# Patient Record
Sex: Female | Born: 1970 | Hispanic: Refuse to answer | Marital: Single | Smoking: Never smoker
Health system: Southern US, Community
[De-identification: ages and names within clinical notes are randomized; demographics above are authoritative.]

## PROBLEM LIST (undated history)

## (undated) DIAGNOSIS — T7840XA Allergy, unspecified, initial encounter: Secondary | ICD-10-CM

## (undated) DIAGNOSIS — F419 Anxiety disorder, unspecified: Secondary | ICD-10-CM

## (undated) DIAGNOSIS — D219 Benign neoplasm of connective and other soft tissue, unspecified: Secondary | ICD-10-CM

## (undated) DIAGNOSIS — N912 Amenorrhea, unspecified: Secondary | ICD-10-CM

## (undated) HISTORY — PX: WISDOM TOOTH EXTRACTION: SHX21

## (undated) HISTORY — DX: Anxiety disorder, unspecified: F41.9

## (undated) HISTORY — DX: Benign neoplasm of connective and other soft tissue, unspecified: D21.9

## (undated) HISTORY — DX: Allergy, unspecified, initial encounter: T78.40XA

## (undated) HISTORY — DX: Amenorrhea, unspecified: N91.2

## (undated) HISTORY — PX: ROOT CANAL: SHX2363

---

## 2016-07-07 ENCOUNTER — Telehealth: Payer: Self-pay | Admitting: Obstetrics and Gynecology

## 2016-07-07 NOTE — Telephone Encounter (Signed)
Called and left a message for patient to call back to schedule a new patient doctor referral. °

## 2016-07-10 NOTE — Telephone Encounter (Signed)
Called and left a message for patient to call back to schedule a new patient doctor referral. °

## 2016-07-14 ENCOUNTER — Encounter: Payer: Self-pay | Admitting: Obstetrics and Gynecology

## 2016-07-14 ENCOUNTER — Ambulatory Visit (INDEPENDENT_AMBULATORY_CARE_PROVIDER_SITE_OTHER): Payer: BLUE CROSS/BLUE SHIELD | Admitting: Obstetrics and Gynecology

## 2016-07-14 VITALS — BP 104/60 | HR 82 | Resp 16 | Ht 67.5 in | Wt 146.4 lb

## 2016-07-14 DIAGNOSIS — Z01419 Encounter for gynecological examination (general) (routine) without abnormal findings: Secondary | ICD-10-CM

## 2016-07-14 DIAGNOSIS — H9319 Tinnitus, unspecified ear: Secondary | ICD-10-CM

## 2016-07-14 DIAGNOSIS — R42 Dizziness and giddiness: Secondary | ICD-10-CM | POA: Diagnosis not present

## 2016-07-14 DIAGNOSIS — N951 Menopausal and female climacteric states: Secondary | ICD-10-CM | POA: Diagnosis not present

## 2016-07-14 DIAGNOSIS — Z124 Encounter for screening for malignant neoplasm of cervix: Secondary | ICD-10-CM

## 2016-07-14 DIAGNOSIS — N911 Secondary amenorrhea: Secondary | ICD-10-CM

## 2016-07-14 DIAGNOSIS — L293 Anogenital pruritus, unspecified: Secondary | ICD-10-CM | POA: Diagnosis not present

## 2016-07-14 LAB — POCT URINE PREGNANCY: PREG TEST UR: NEGATIVE

## 2016-07-14 MED ORDER — BETAMETHASONE VALERATE 0.1 % EX OINT: TOPICAL_OINTMENT | CUTANEOUS | 0 refills | Status: DC

## 2016-07-14 MED ORDER — MEDROXYPROGESTERONE ACETATE 5 MG PO TABS: ORAL_TABLET | ORAL | 0 refills | Status: DC

## 2016-07-14 NOTE — Progress Notes (Signed)
45 y.o. G0P0000 MarriedCaucasianF here for evaluation from Ronny Bacon, ANP at [facility] for amenorrhea and irregular cycles. She is also due for an annual exam. FSH was 90.3 and her prolactin was "normal" the patient was told her TSH was normal as well. LMP in 4/17. Prior to April her cycles were every 1-2 months for the last year (previously monthly).  Period Duration (Days): 4 days Period Pattern: (!) Irregular Menstrual Flow: Moderate Menstrual Control: Maxi pad, Tampon Menstrual Control Change Freq (Hours): 2-3 hours on heavy days Dysmenorrhea: (!) Mild Dysmenorrhea Symptoms: Cramping  She was under a lot of stress in 4/17, getting better.  For a period of some months last year she was not in a good living environment. She felt a racing heart, buzzy in her ears. Feeling of vibration over her body. Those symptoms have improved.  Some night sweats, tolerable. No hot flashes. No vaginal dryness. Occasionally sexually active, no pain. Some episodes of feeling dizzy or off, not unstable. Some issues with focus and low energy. Low level depression. She has had issue with constipation, resolved with dietary changes.  She does have dry skin, some hair thinning.  Not sleeping great.  She would like to have a baby, wondering if her cycles may come back to help with conception  Patient's last menstrual period was 02/02/2016.          Sexually active: Yes.    The current method of family planning is none.    Exercising: Yes.    Various Smoker:  no  Health Maintenance: Pap:  2016 WNL per patient History of abnormal Pap:  no MMG: 2016 per patient  Colonoscopy:  no BMD:   no TDaP:  10/27/2015    reports that she has never smoked. She has never used smokeless tobacco. She reports that she drinks about 0.6 oz of alcohol per week . She reports that she does not use drugs. She is Systems analyst at [employer].   Past Medical History:  Diagnosis Date  . Amenorrhea     Past Surgical  History:  Procedure Laterality Date  . ROOT CANAL    . WISDOM TOOTH EXTRACTION      Current Outpatient Prescriptions  Medication Sig Dispense Refill  . b complex vitamins capsule Take 1 capsule by mouth daily.    . Iodine, Kelp, (KELP PO) Take by mouth.    . Multiple Vitamin (MULTIVITAMIN) capsule Take 1 capsule by mouth daily.    . Omega-3 Fatty Acids (FISH OIL) 1000 MG CAPS Take by mouth.    Marland Kitchen UNABLE TO FIND Med Name: Tumeric     No current facility-administered medications for this visit.     Family History  Problem Relation Age of Onset  . Thyroid disease Father   . Hyperthyroidism Father   . Hypertension Maternal Aunt   . Heart attack Maternal Aunt 80  . Diabetes Paternal Aunt   . Heart attack Paternal Aunt 99    Review of Systems  Constitutional: Negative.   HENT: Negative.   Eyes: Negative.   Respiratory: Negative.   Cardiovascular: Negative.   Gastrointestinal: Negative.   Endocrine: Negative.   Genitourinary:       Amenorrhea Irregular Menses   Musculoskeletal: Negative.   Skin: Negative.   Allergic/Immunologic: Negative.   Neurological: Negative.   Hematological: Negative.   Psychiatric/Behavioral: Negative.     Exam:   BP 104/60 (BP Location: Right Arm, Patient Position: Sitting, Cuff Size: Normal)   Pulse 82  Resp 16   Ht 5' 7.5" (1.715 m)   Wt 146 lb 6.4 oz (66.4 kg)   LMP 02/02/2016   BMI 22.59 kg/m   Weight change: @WEIGHTCHANGE @ Height:   Height: 5' 7.5" (171.5 cm)  Ht Readings from Last 3 Encounters:  07/14/16 5' 7.5" (1.715 m)    General appearance: alert, cooperative and appears stated age Head: Normocephalic, without obvious abnormality, atraumatic Neck: no adenopathy, supple, symmetrical, trachea midline and thyroid normal to inspection and palpation Lungs: clear to auscultation bilaterally Breasts: normal appearance, no masses or tenderness Heart: regular rate and rhythm Abdomen: soft, non-tender; bowel sounds normal; no masses,   no organomegaly Extremities: extremities normal, atraumatic, no cyanosis or edema Skin: Skin color, texture, turgor normal. No rashes or lesions Lymph nodes: Cervical, supraclavicular, and axillary nodes normal. No abnormal inguinal nodes palpated Neurologic: Grossly normal   Pelvic: External genitalia:  On her upper labia majora (L>R) are patches of erythema/irritation              Urethra:  normal appearing urethra with no masses, tenderness or lesions              Bartholins and Skenes: normal                 Vagina: normal appearing vagina with normal color and discharge, no lesions              Cervix: no lesions               Bimanual Exam:  Uterus:  normal size, contour, position, consistency, mobility, non-tender              Adnexa: no mass, fullness, tenderness               Rectovaginal: Confirms               Anus:  normal sphincter tone, no lesions  Chaperone was present for exam.  A:  Well Woman with normal exam  Genital pruritus, seems focal, but will r/o vaginitis  Desires pregnancy (no current partners)  Perimenopausal  Amenorrhea, PMP FSH level  Buzzing in her ears  Sensation of vibrations, slightly dizzy  Low focus and low energy, she reports normal TSH  P:   Pap with hpv  Steroids for vulvar dermatitis  Provera withdrawal now and every other month if no spontaneous cycles  Refer to ENT  She needs an internist given all of her systemic symptoms, #'s given  Given # for E&I  Mammogram q year    CC: Ronny Bacon, ANP at [facility]

## 2016-07-15 LAB — WET PREP BY MOLECULAR PROBE
Candida species: NEGATIVE
GARDNERELLA VAGINALIS: POSITIVE — AB
TRICHOMONAS VAG: NEGATIVE

## 2016-07-16 LAB — IPS PAP TEST WITH HPV

## 2016-07-20 ENCOUNTER — Telehealth: Payer: Self-pay | Admitting: *Deleted

## 2016-07-20 MED ORDER — METRONIDAZOLE 0.75 % VA GEL: 1.0000 | Freq: Every day | VAGINAL | 0 refills | Status: DC

## 2016-07-20 NOTE — Telephone Encounter (Signed)
-----   Message from Salvadore Dom, MD sent at 07/17/2016 12:21 PM EDT ----- Please inform the patient that her wet prep returned with BV. She was c/o patchy itching externally. She doesn't have to treat the BV if not symptomatic (typically increased d/c and odor, can get vulvar irritation) If she wants treatment, no ETOH while on Flagyl.  Oral: Flagyl 500 mg BID x 7 days, or Vaginal: Metrogel, 1 applicator per vagina q day x 5 days. 02

## 2016-07-20 NOTE — Telephone Encounter (Signed)
Left message to call Sharee Pimple back at [phone].

## 2016-07-20 NOTE — Telephone Encounter (Signed)
Spoke with patient. Patient calling to get name of medication that was sent to pharmacy. Advised metronidazole (Metrogel) 0.75%. Patient states she may need RX sent to [facility] instead; patient to return call if she needs pharmacy changed.

## 2016-07-21 ENCOUNTER — Telehealth: Payer: Self-pay | Admitting: Obstetrics and Gynecology

## 2016-07-21 MED ORDER — METRONIDAZOLE 0.75 % VA GEL
1.0000 | Freq: Every day | VAGINAL | 0 refills | Status: AC
Start: 2016-07-21 — End: 2016-07-26

## 2016-07-21 NOTE — Telephone Encounter (Signed)
Patient would like to speak with nurse about getting a prescription for her medication sent to a different pharmacy. [facility] at [phone].

## 2016-07-21 NOTE — Telephone Encounter (Signed)
Spoke with patient. Patient request Metrogel be sent to [facility] at [phone]. Called [facility] on [facility], verified with Jenny Reichmann, medication had not been picked up and discontinued med at that time. New order sent to [facility] as requested.   Routing to provider for final review. Patient is agreeable to disposition. Will close encounter.

## 2016-07-27 ENCOUNTER — Encounter: Payer: Self-pay | Admitting: Obstetrics and Gynecology

## 2016-07-30 ENCOUNTER — Telehealth: Payer: Self-pay | Admitting: *Deleted

## 2016-07-30 NOTE — Telephone Encounter (Signed)
Spoke with patient. Patient calling to review lab results again as seen below. Patient states she just received message that pharmacy has medication in stock. Patient verbalizes understanding.   Routing to provider for final review. Patient is agreeable to disposition. Will close encounter.      Notes Recorded by Burnice Logan, RN on 07/20/2016 at 9:29 AM EDT Advised of results as seen below per Dr. Talbert Nan. Metrogel sent to pharmacy on file, verified by patient.-jh 02 recall placed. ------  Notes Recorded by Salvadore Dom, MD on 07/17/2016 at 12:21 PM EDT Please inform the patient that her wet prep returned with BV. She was c/o patchy itching externally. She doesn't have to treat the BV if not symptomatic (typically increased d/c and odor, can get vulvar irritation) If she wants treatment, no ETOH while on Flagyl.  Oral: Flagyl 500 mg BID x 7 days, or Vaginal: Metrogel, 1 applicator per vagina q day x 5 days. 02

## 2016-09-23 ENCOUNTER — Encounter: Payer: Self-pay | Admitting: Student

## 2016-09-23 ENCOUNTER — Ambulatory Visit (INDEPENDENT_AMBULATORY_CARE_PROVIDER_SITE_OTHER): Payer: BLUE CROSS/BLUE SHIELD | Admitting: Student

## 2016-09-23 VITALS — BP 115/76 | Ht 67.5 in | Wt 148.0 lb

## 2016-09-23 DIAGNOSIS — G8929 Other chronic pain: Secondary | ICD-10-CM | POA: Insufficient documentation

## 2016-09-23 DIAGNOSIS — M545 Low back pain: Secondary | ICD-10-CM | POA: Diagnosis not present

## 2016-09-23 DIAGNOSIS — M9905 Segmental and somatic dysfunction of pelvic region: Secondary | ICD-10-CM | POA: Diagnosis not present

## 2016-09-23 DIAGNOSIS — M9903 Segmental and somatic dysfunction of lumbar region: Secondary | ICD-10-CM

## 2016-09-23 DIAGNOSIS — M9904 Segmental and somatic dysfunction of sacral region: Secondary | ICD-10-CM | POA: Diagnosis not present

## 2016-09-23 DIAGNOSIS — I5032 Chronic diastolic (congestive) heart failure: Secondary | ICD-10-CM | POA: Insufficient documentation

## 2016-09-23 NOTE — Assessment & Plan Note (Signed)
Discussed benefits/risk of osteopathic manipulative therapy discussed with patient.  Manipulation performed in form of HVLA, muscle energy, myofascial release performed to lumbar spine, pelvis, sacrum.  Patient tolerated therapy well.  Subjective and objective improvement noted. Instructed that may be sore overnight and can take anti-inflammatories for pain.  Advised to stay hydrated today.  Exercises given to patient and demonstrated in office.

## 2016-09-23 NOTE — Progress Notes (Signed)
  Valerie Gregory - 45 y.o. female MRN [id number]  Date of birth: Oct 18, 1971  SUBJECTIVE:  Including CC & ROS.  CC: Low back pain  Complains of low back pain that has been ongoing for unspecified amount time. She denies any radicular symptoms or any numbness or tingling. She reports that it sometimes keeps her up at night. She has tried anti-inflammatories without much relief. She has been doing exercises for the back without much relief. 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 She was seen at [facility] for this and given exercises to do. She has been doing them intermittently.     ROS: No unexpected weight loss, fever, chills, swelling, instability, muscle pain, numbness/tingling, redness, otherwise see HPI   PMHx - Updated and reviewed.  Contributory factors include: Negative PSHx - Updated and reviewed.  Contributory factors include:  Negative FHx - Updated and reviewed.  Contributory factors include:  Negative Social Hx - Updated and reviewed. Contributory factors include: Negative Medications - reviewed   DATA REVIEWED: none  PHYSICAL EXAM:  VS: BP:115/76  HR: bpm  TEMP: ( )  RESP:   HT:5' 7.5" (171.5 cm)   WT:148 lb (67.1 kg)  BMI:22.9 PHYSICAL EXAM: Gen: NAD, alert, cooperative with exam, well-appearing HEENT: clear conjunctiva,  CV:  no edema, capillary refill brisk, normal rate Resp: non-labored Skin: no rashes, normal turgor  Neuro: no gross deficits.  Psych:  alert and oriented  Back Exam:  Inspection: Unremarkable  Palpable tenderness: Right greater than left paraspinal hypertonicity. Range of Motion:  Flexion 45 deg; Extension 45 deg; Side Bending to 45 deg bilaterally; Rotation to 45 deg bilaterally  Leg strength: Quad: 5/5 Hamstring: 5/5 Hip flexor: 5/5 Hip abductors: 5/5  Strength at foot: Plantar-flexion: 5/5 Dorsi-flexion: 5/5 Eversion: 5/5 Inversion: 5/5  Sensory change: Gross sensation intact to all lumbar and sacral dermatomes.  Reflexes: 2+ at both patellar  tendons, 2+ at achilles tendons, Babinski's downgoing.  Gait unremarkable. SLR laying: Negative  XSLR laying: Negative  FABER: negative.  OSE: B/l paraspinal hypertonicity in lumbar spine R>L RlSr L2-5  Right anterior innominate   ASSESSMENT & PLAN:   Chronic bilateral low back pain without sciatica Discussed benefits/risk of osteopathic manipulative therapy discussed with patient.  Manipulation performed in form of HVLA, muscle energy, myofascial release performed to lumbar spine, pelvis, sacrum.  Patient tolerated therapy well.  Subjective and objective improvement noted. Instructed that may be sore overnight and can take anti-inflammatories for pain.  Advised to stay hydrated today.  Exercises given to patient and demonstrated in office.

## 2017-01-15 ENCOUNTER — Telehealth: Payer: Self-pay | Admitting: Obstetrics and Gynecology

## 2017-01-15 NOTE — Telephone Encounter (Signed)
Left message to call Knolan Simien at [phone]. 

## 2017-01-15 NOTE — Telephone Encounter (Signed)
Patient would like her  Lab results

## 2017-01-18 NOTE — Telephone Encounter (Signed)
Left message to call Valerie Gregory at [phone]. 

## 2017-01-18 NOTE — Telephone Encounter (Signed)
Spoke with patient. Patient states she is calling about her [facility] lab results from 06/2016. Advised per review of chart no [place] level was drawn. "I just can't remember the results." Patient had a pap and affirm testing on 07/14/2016. Patient states she had hormone levels checked and was contacted by our office to review these results. Reviewed Care Everywhere and I do not see any hormone level testing. Patient states this was done with our office. Advised will review with Dr.Jertson and return call. Patient is agreeable.  Per further review of chart I see that hormone testing was done at [place] center on 07/02/2016 and patient was referred to our office based on this visit. Routing to [place] for review of [place] level which was 90.3 normal 1.6-8.0 and Prolactin was normal.

## 2017-01-18 NOTE — Telephone Encounter (Signed)
Her FSH was in the postmenopausal range at 90. She was still considered peri-menopausal because she hadn't gone a year without menses. She was given cyclic provera to take every other month, check if she ever took this. Also see if she has had any cycles or bleeding since her last visit. Did she have a particular question about her hormones?

## 2017-01-18 NOTE — Telephone Encounter (Signed)
Patient returning your call.

## 2017-02-04 NOTE — Telephone Encounter (Signed)
A year without menses with her prior elevated FSH level is consistent with menopause. She could see the infertility specialist and discuss donor egg.  If she is interested, we can refer her.

## 2017-02-04 NOTE — Telephone Encounter (Signed)
Spoke with patient. Advised of message as seen below from [place]. Patient verbalizes understanding. Patient states that she has not had any bleeding since 02/10/2016. States she did not take Provera as prescribed. Patient reports she was calling as she is interested in possible fertility. Has questions about next steps in possibly getting pregnant with her hormone levels. Advised will review with Dr.Jertson and return call with recommendations for next steps. Advised additional lab work and referral to fertility specialist will likely be next steps.

## 2017-02-05 NOTE — Telephone Encounter (Signed)
Left message to call Koralyn Prestage at [phone]. 

## 2017-02-15 NOTE — Telephone Encounter (Signed)
Left message to call Tiarna Koppen at [phone]. 

## 2017-02-18 NOTE — Telephone Encounter (Signed)
Dr.Jertson, I have attempted to reach this patient x 2 without return call. Okay to close encounter?

## 2017-02-18 NOTE — Telephone Encounter (Signed)
Will close encounter

## 2017-12-16 ENCOUNTER — Other Ambulatory Visit: Payer: Self-pay | Admitting: Nurse Practitioner

## 2017-12-16 DIAGNOSIS — Z1231 Encounter for screening mammogram for malignant neoplasm of breast: Secondary | ICD-10-CM

## 2019-12-31 ENCOUNTER — Ambulatory Visit: Payer: Self-pay | Attending: Internal Medicine

## 2019-12-31 DIAGNOSIS — Z23 Encounter for immunization: Secondary | ICD-10-CM | POA: Insufficient documentation

## 2019-12-31 NOTE — Progress Notes (Signed)
   Covid-19 Vaccination Clinic  Name:  Valerie Gregory    MRN: [id number] DOB: [birth year]  12/31/2019  Ms. He was observed post Covid-19 immunization for 30 minutes based on pre-vaccination screening without incident. She was provided with Vaccine Information Sheet and instruction to access the V-Safe system.   [name] was instructed to call 911 with any severe reactions post vaccine: Marland Kitchen Difficulty breathing  . Swelling of face and throat  . A fast heartbeat  . A bad rash all over body  . Dizziness and weakness   Immunizations Administered    Name Date Dose VIS Date Route   Pfizer COVID-19 Vaccine 12/31/2019  1:03 PM 0.3 mL 10/06/2019 Intramuscular   Manufacturer: [place]   Lot: [id number]   [place]: [id number]

## 2020-01-31 ENCOUNTER — Ambulatory Visit: Payer: Self-pay

## 2020-02-14 ENCOUNTER — Ambulatory Visit: Payer: Self-pay | Attending: Internal Medicine

## 2020-02-14 DIAGNOSIS — Z23 Encounter for immunization: Secondary | ICD-10-CM

## 2020-02-14 NOTE — Progress Notes (Signed)
   Covid-19 Vaccination Clinic  Name:  Valerie Gregory    MRN: [id number] DOB: [birth year]  02/14/2020  [name] was observed post Covid-19 immunization for 15 minutes without incident. She was provided with Vaccine Information Sheet and instruction to access the V-Safe system.   [name] was instructed to call 911 with any severe reactions post vaccine: Marland Kitchen Difficulty breathing  . Swelling of face and throat  . A fast heartbeat  . A bad rash all over body  . Dizziness and weakness   Immunizations Administered    Name Date Dose VIS Date Route   Pfizer COVID-19 Vaccine 02/14/2020  9:44 AM 0.3 mL 12/20/2018 Intramuscular   Manufacturer: [place]   Lot: [id number]   [place]: [id number]

## 2020-03-27 ENCOUNTER — Ambulatory Visit: Payer: 59 | Admitting: Psychology

## 2020-04-09 ENCOUNTER — Ambulatory Visit (INDEPENDENT_AMBULATORY_CARE_PROVIDER_SITE_OTHER): Payer: 59 | Admitting: Psychology

## 2020-04-09 DIAGNOSIS — F411 Generalized anxiety disorder: Secondary | ICD-10-CM

## 2020-04-24 ENCOUNTER — Ambulatory Visit (INDEPENDENT_AMBULATORY_CARE_PROVIDER_SITE_OTHER): Payer: 59 | Admitting: Psychology

## 2020-04-24 DIAGNOSIS — F411 Generalized anxiety disorder: Secondary | ICD-10-CM | POA: Diagnosis not present

## 2020-05-01 ENCOUNTER — Ambulatory Visit (INDEPENDENT_AMBULATORY_CARE_PROVIDER_SITE_OTHER): Payer: 59 | Admitting: Psychology

## 2020-05-01 DIAGNOSIS — F411 Generalized anxiety disorder: Secondary | ICD-10-CM

## 2020-05-07 ENCOUNTER — Ambulatory Visit (INDEPENDENT_AMBULATORY_CARE_PROVIDER_SITE_OTHER): Payer: 59 | Admitting: Psychology

## 2020-05-07 DIAGNOSIS — F411 Generalized anxiety disorder: Secondary | ICD-10-CM | POA: Diagnosis not present

## 2020-08-08 ENCOUNTER — Other Ambulatory Visit: Payer: Self-pay

## 2020-08-08 ENCOUNTER — Encounter: Payer: Self-pay | Admitting: Family Medicine

## 2020-08-08 ENCOUNTER — Ambulatory Visit (INDEPENDENT_AMBULATORY_CARE_PROVIDER_SITE_OTHER): Payer: 59 | Admitting: Family Medicine

## 2020-08-08 VITALS — BP 105/74 | HR 86 | Temp 98.2°F | Ht 67.5 in | Wt 142.0 lb

## 2020-08-08 DIAGNOSIS — R5383 Other fatigue: Secondary | ICD-10-CM | POA: Diagnosis not present

## 2020-08-08 DIAGNOSIS — T781XXA Other adverse food reactions, not elsewhere classified, initial encounter: Secondary | ICD-10-CM

## 2020-08-08 DIAGNOSIS — H6123 Impacted cerumen, bilateral: Secondary | ICD-10-CM

## 2020-08-08 DIAGNOSIS — H538 Other visual disturbances: Secondary | ICD-10-CM

## 2020-08-08 DIAGNOSIS — R079 Chest pain, unspecified: Secondary | ICD-10-CM | POA: Diagnosis not present

## 2020-08-08 DIAGNOSIS — Z13220 Encounter for screening for lipoid disorders: Secondary | ICD-10-CM

## 2020-08-08 DIAGNOSIS — R221 Localized swelling, mass and lump, neck: Secondary | ICD-10-CM

## 2020-08-08 MED ORDER — EPINEPHRINE 0.3 MG/0.3ML IJ SOAJ: 0.3000 mg | Freq: Once | INTRAMUSCULAR | 0 refills | Status: DC | PRN

## 2020-08-08 NOTE — Progress Notes (Signed)
Valerie Gregory is a 49 y.o. female who presents today for an office visit.  She is a new patient.   Assessment/Plan:  New/Acute Problems: Chest Pain No red flags.  No current symptoms. Reassuring EKG.  Will place referral to cardiology per patient request.  Neck Mass Likely connective tissue though given length of symptoms will check ultrasound to rule out other possible causes  Blurred Vision Reassuring exam today.  Patient has family history of multiple sclerosis.  Given this in addition to her intermittent episodes of numbness will place referral to neurology for further evaluation.  May need MRI.  Cerumenosis Recommended over-the-counter Debrox or hydrogen peroxide as needed.   Chronic Problems Addressed Today: No problem-specific Assessment & Plan notes found for this encounter.     Subjective:  HPI:  She has several issues she would like to discuss today.  She has had longstanding issue with fatigue.  Does not remember when symptoms started.  She has been under quite a bit of stress recently due to declining health of her mother.  She would like to have lab work done today.  She is worried about her thyroid level and concern also about potentially being anemic.  Sleeping has been okay.  She has also had intermittent issues with chest pain for the last 5 months. Symptom started about 4 weeks after getting her second dose of Covid vaccine.  She is not sure if the 2 things are related.  Symptoms are random and come and go.  Occasionally associated with shortness of breath in the middle of the night with bradycardia into the 50s.  She would like to see a cardiologist.  She has also had intermittent facial numbness twice.  Initially happened after her first  covid vaccine.  Lasted for about a week and then subsided.  She had similar reaction after her second dose which was more intense but shorter duration.  She has also had some intermittent difficulty seeing out of her right  eye.  She associates these with pain and the right side of her neck.  She has not had any other associated weakness or numbness.  She also has a longstanding history of food intolerance.  Her source of her intolerance seems to vary.  She will sometimes have hives.  Sometimes have facial swelling and itchy sensation in her throat.  PMH:  The following were reviewed and entered/updated in epic: Past Medical History:  Diagnosis Date  . Allergy   . Amenorrhea   . Anxiety    Patient Active Problem List   Diagnosis Date Noted  . Other fatigue 08/08/2020  . Gastrointestinal food sensitivity 08/08/2020   Past Surgical History:  Procedure Laterality Date  . ROOT CANAL    . WISDOM TOOTH EXTRACTION      Family History  Problem Relation Age of Onset  . Thyroid disease Father   . Hyperthyroidism Father   . Hypertension Maternal Aunt   . Heart attack Maternal Aunt 80  . Diabetes Paternal Aunt   . Heart attack Paternal Aunt 53  . Multiple sclerosis Mother     Medications- reviewed and updated Current Outpatient Medications  Medication Sig Dispense Refill  . b complex vitamins capsule Take 1 capsule by mouth daily.    . cholecalciferol (VITAMIN D3) 25 MCG (1000 UNIT) tablet Take 1,000 Units by mouth daily.    . medroxyPROGESTERone (PROVERA) 5 MG tablet 1 tab po qd x 5 days now and every other month if no spontaneous menses. 15  tablet 0  . Multiple Vitamin (MULTIVITAMIN) capsule Take 1 capsule by mouth daily.    . Omega-3 Fatty Acids (FISH OIL) 1000 MG CAPS Take by mouth.    Marland Kitchen UNABLE TO FIND Med Name: Tumeric    . EPINEPHrine 0.3 mg/0.3 mL IJ SOAJ injection Inject 0.3 mg into the muscle once as needed (anaphylaxis/allergic reaction). 2 each 0   No current facility-administered medications for this visit.    Allergies-reviewed and updated Allergies  Allergen Reactions  . Shellfish-Derived Products Anaphylaxis, Hives and Swelling  . Flublok [Influenza Vaccine Recombinant]   . Sulfa  Antibiotics Hives    Social History   Socioeconomic History  . Marital status: Married    Spouse name: Not on file  . Number of children: Not on file  . Years of education: Not on file  . Highest education level: Not on file  Occupational History  . Not on file  Tobacco Use  . Smoking status: Never Smoker  . Smokeless tobacco: Never Used  Substance and Sexual Activity  . Alcohol use: Yes    Alcohol/week: 1.0 standard drink    Types: 1 Standard drinks or equivalent per week  . Drug use: No  . Sexual activity: Yes    Partners: Male    Birth control/protection: None  Other Topics Concern  . Not on file  Social History Narrative  . Not on file   Social Determinants of Health   Financial Resource Strain:   . Difficulty of Paying Living Expenses: Not on file  Food Insecurity:   . Worried About Charity fundraiser in the Last Year: Not on file  . Ran Out of Food in the Last Year: Not on file  Transportation Needs:   . Lack of Transportation (Medical): Not on file  . Lack of Transportation (Non-Medical): Not on file  Physical Activity:   . Days of Exercise per Week: Not on file  . Minutes of Exercise per Session: Not on file  Stress:   . Feeling of Stress : Not on file  Social Connections:   . Frequency of Communication with Friends and Family: Not on file  . Frequency of Social Gatherings with Friends and Family: Not on file  . Attends Religious Services: Not on file  . Active Member of Clubs or Organizations: Not on file  . Attends Archivist Meetings: Not on file  . Marital Status: Not on file          Objective:  Physical Exam: BP 105/74   Pulse 86   Temp 98.2 F (36.8 C) (Temporal)   Ht 5' 7.5" (1.715 m)   Wt 142 lb (64.4 kg)   SpO2 99%   BMI 21.91 kg/m   Gen: No acute distress, resting comfortably CV: Regular rate and rhythm with no murmurs appreciated HEENT: Approximately 1 cm mass on right posterior paraspinal cervical muscle group.   Nonmobile.  Narrow EAC bilaterally. Pulm: Normal work of breathing, clear to auscultation bilaterally with no crackles, wheezes, or rhonchi Neuro: Grossly normal, moves all extremities.  Reflexes 2+ and symmetric bilaterally.  Finger-nose-finger testing intact.  Cranial nerves III through XII intact Psych: Normal affect and thought content  EKG: Normal sinus rhythm.  No ischemic changes.  Time Spent: 65 minutes of total time was spent on the date of the encounter performing the following actions: chart review prior to seeing the patient, obtaining history, performing a medically necessary exam, counseling on the treatment plan including labs today and referrals,  placing orders, and documenting in our EHR.        Algis Greenhouse. Jerline Pain, MD 08/08/2020 9:32 AM

## 2020-08-08 NOTE — Assessment & Plan Note (Signed)
Discussed referral to allergist however she declined.  Will give prescription for EpiPen.  Also recommend she have Benadryl and Pepcid on hand.

## 2020-08-08 NOTE — Assessment & Plan Note (Signed)
Likely multifactorial.  Will check labs today including CBC, CMET, TSH, B12, and vitamin D.

## 2020-08-08 NOTE — Patient Instructions (Addendum)
It was very nice to see you today!  We will check blood work today.  I will place a referral for you to see the neurologist and cardiologist.  Please try using Debrox optional peroxide for your ears.  We will get an ultrasound of your neck.  I will send in a refill for your EpiPen.  Take care, Dr Jerline Pain  Please try these tips to maintain a healthy lifestyle:   Eat at least 3 REAL meals and 1-2 snacks per day.  Aim for no more than 5 hours between eating.  If you eat breakfast, please do so within one hour of getting up.    Each meal should contain half fruits/vegetables, one quarter protein, and one quarter carbs (no bigger than a computer mouse)   Cut down on sweet beverages. This includes juice, soda, and sweet tea.     Drink at least 1 glass of water with each meal and aim for at least 8 glasses per day   Exercise at least 150 minutes every week.

## 2020-08-09 LAB — COMPREHENSIVE METABOLIC PANEL
AG Ratio: 2.2 (calc) (ref 1.0–2.5)
ALT: 15 U/L (ref 6–29)
AST: 21 U/L (ref 10–35)
Albumin: 4.6 g/dL (ref 3.6–5.1)
Alkaline phosphatase (APISO): 59 U/L (ref 37–153)
BUN: 14 mg/dL (ref 7–25)
CO2: 28 mmol/L (ref 20–32)
Calcium: 9.6 mg/dL (ref 8.6–10.4)
Chloride: 107 mmol/L (ref 98–110)
Creat: 0.7 mg/dL (ref 0.50–1.05)
Globulin: 2.1 g/dL (calc) (ref 1.9–3.7)
Glucose, Bld: 95 mg/dL (ref 65–99)
Potassium: 4.2 mmol/L (ref 3.5–5.3)
Sodium: 143 mmol/L (ref 135–146)
Total Bilirubin: 1.6 mg/dL — ABNORMAL HIGH (ref 0.2–1.2)
Total Protein: 6.7 g/dL (ref 6.1–8.1)

## 2020-08-09 LAB — LIPID PANEL
Cholesterol: 191 mg/dL (ref <200)
HDL: 75 mg/dL (ref >=50)
LDL Cholesterol (Calc): 103 mg/dL (calc) — ABNORMAL HIGH
Non-HDL Cholesterol (Calc): 116 mg/dL (calc) (ref <130)
Total CHOL/HDL Ratio: 2.5 (calc) (ref <5.0)
Triglycerides: 52 mg/dL (ref <150)

## 2020-08-09 LAB — CBC
HCT: 41.3 % (ref 35.0–45.0)
Hemoglobin: 14 g/dL (ref 11.7–15.5)
MCH: 29.3 pg (ref 27.0–33.0)
MCHC: 33.9 g/dL (ref 32.0–36.0)
MCV: 86.4 fL (ref 80.0–100.0)
MPV: 10.3 fL (ref 7.5–12.5)
Platelets: 180 10*3/uL (ref 140–400)
RBC: 4.78 10*6/uL (ref 3.80–5.10)
RDW: 12.4 % (ref 11.0–15.0)
WBC: 3.5 10*3/uL — ABNORMAL LOW (ref 3.8–10.8)

## 2020-08-09 LAB — VITAMIN B12: Vitamin B-12: 507 pg/mL (ref 200–1100)

## 2020-08-09 LAB — VITAMIN D 25 HYDROXY (VIT D DEFICIENCY, FRACTURES): Vit D, 25-Hydroxy: 34 ng/mL (ref 30–100)

## 2020-08-09 LAB — TSH: TSH: 0.53 mIU/L

## 2020-08-09 NOTE — Progress Notes (Signed)
Please inform patient of the following:  Cholesterol level is borderline. Do not need to start meds but she should continue working on diet and exercise and we can recheck in a year.  Her bilirubin was elevated - this is probably benign but would like to recheck. Please place future order for fractionated bilirubin  Everything else is NORMAL.  Algis Greenhouse. Jerline Pain, MD 08/09/2020 2:04 PM

## 2020-08-13 ENCOUNTER — Telehealth: Payer: Self-pay

## 2020-08-13 ENCOUNTER — Other Ambulatory Visit: Payer: Self-pay

## 2020-08-13 DIAGNOSIS — M542 Cervicalgia: Secondary | ICD-10-CM

## 2020-08-13 NOTE — Telephone Encounter (Signed)
Pt called in and is requesting a referral to Dr. Yvetta Coder.

## 2020-08-13 NOTE — Addendum Note (Signed)
Addended by: Clyde Lundborg A on: 08/13/2020 03:07 PM   Modules accepted: Orders

## 2020-08-13 NOTE — Telephone Encounter (Signed)
Ok to place order for dg cervical spin complete.  Algis Greenhouse. Jerline Pain, MD 08/13/2020 3:01 PM

## 2020-08-13 NOTE — Telephone Encounter (Signed)
Pt called stating she would also like an x-ray of her neck.

## 2020-08-13 NOTE — Telephone Encounter (Signed)
Ok to order or OV needed?

## 2020-08-13 NOTE — Addendum Note (Signed)
Addended by: Clyde Lundborg A on: 08/13/2020 03:37 PM   Modules accepted: Orders

## 2020-08-13 NOTE — Telephone Encounter (Signed)
Xray ordered, called to make pt aware however pt vm has not been set up.

## 2020-08-13 NOTE — Telephone Encounter (Signed)
Called and spoke with pt, pt needs this referral for her neck pain. Referral has been placed.

## 2020-08-14 ENCOUNTER — Ambulatory Visit
Admission: RE | Admit: 2020-08-14 | Discharge: 2020-08-14 | Disposition: A | Discharge status: Home / Self Care | Payer: 59 | Source: Ambulatory Visit | Attending: Family Medicine | Admitting: Family Medicine

## 2020-08-14 DIAGNOSIS — M542 Cervicalgia: Secondary | ICD-10-CM

## 2020-08-14 DIAGNOSIS — R221 Localized swelling, mass and lump, neck: Secondary | ICD-10-CM

## 2020-08-15 ENCOUNTER — Other Ambulatory Visit: Payer: Self-pay

## 2020-08-15 ENCOUNTER — Ambulatory Visit (INDEPENDENT_AMBULATORY_CARE_PROVIDER_SITE_OTHER): Payer: 59 | Admitting: Sports Medicine

## 2020-08-15 VITALS — BP 108/78 | Ht 67.5 in | Wt 145.0 lb

## 2020-08-15 DIAGNOSIS — S161XXA Strain of muscle, fascia and tendon at neck level, initial encounter: Secondary | ICD-10-CM | POA: Diagnosis not present

## 2020-08-15 NOTE — Progress Notes (Signed)
Please inform patient of the following:  Her xray and ultrasound showed no significant abnormalities. She is probably having a severe muscular strain. Would like for her to follow up with sports medicine as planned.  Algis Greenhouse. Jerline Pain, MD 08/15/2020 10:43 AM

## 2020-08-16 ENCOUNTER — Other Ambulatory Visit: Payer: Self-pay | Admitting: *Deleted

## 2020-08-16 DIAGNOSIS — M542 Cervicalgia: Secondary | ICD-10-CM

## 2020-08-16 NOTE — Progress Notes (Signed)
   Subjective:    Patient ID: Valerie Gregory, female    DOB: September 13, 1971, 49 y.o.   MRN: [id number]  HPI chief complaint: Neck pain  Patient is a 49 year old female that comes in today complaining of several months of posterior neck pain.  No trauma that she can recall.  She describes popping and stiffness in the neck which has her concerned.  She saw her primary care physician yesterday and x-rays as well as an ultrasound of her neck were ordered.  X-rays show straightening of the normal cervical lordosis but nothing acute.  No significant degenerative changes.  Ultrasound was unremarkable.  She thinks that her pain may be postural.  She has not tried any medications.  She has not tried heat nor ice.  She denies numbness or tingling into her arms.  No prior neck surgery.  She did find some home exercises online but has not yet started doing them.  Past medical history reviewed Medications reviewed Allergies reviewed    Review of Systems    As above Objective:   Physical Exam  Well-developed, well-nourished.  No acute distress.  Cervical spine: Full cervical range of motion.  No tenderness to palpation along the midline.  Mild paraspinal musculature spasm bilaterally.  There are no gross focal neurological deficits of either upper extremity.  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  I did discuss referral to formal physical therapy for treatment with Raeford Razor if symptoms persist.  Of also recommended moist heat as needed for pain and spasm.  Reassurance is given regarding her cervical spine x-rays and soft tissue ultrasound.  Follow-up as needed.

## 2020-08-21 ENCOUNTER — Other Ambulatory Visit: Payer: 59

## 2020-08-21 ENCOUNTER — Encounter: Payer: Self-pay | Admitting: General Practice

## 2020-08-28 ENCOUNTER — Encounter: Payer: Self-pay | Admitting: Obstetrics and Gynecology

## 2020-09-25 ENCOUNTER — Ambulatory Visit (INDEPENDENT_AMBULATORY_CARE_PROVIDER_SITE_OTHER): Payer: 59 | Admitting: Psychology

## 2020-09-25 DIAGNOSIS — F411 Generalized anxiety disorder: Secondary | ICD-10-CM | POA: Diagnosis not present

## 2020-10-15 ENCOUNTER — Ambulatory Visit (INDEPENDENT_AMBULATORY_CARE_PROVIDER_SITE_OTHER): Payer: 59 | Admitting: Psychology

## 2020-10-15 DIAGNOSIS — F411 Generalized anxiety disorder: Secondary | ICD-10-CM | POA: Diagnosis not present

## 2020-10-23 ENCOUNTER — Ambulatory Visit (INDEPENDENT_AMBULATORY_CARE_PROVIDER_SITE_OTHER): Payer: 59 | Admitting: Psychology

## 2020-10-23 DIAGNOSIS — F411 Generalized anxiety disorder: Secondary | ICD-10-CM | POA: Diagnosis not present

## 2020-10-27 NOTE — Progress Notes (Signed)
Cardiology Office Note:    Date:  10/30/2020   ID:  Lyman Bishop, DOB 11-11-1970, MRN [id number]  PCP:  Valerie Barrack, MD  Cardiologist:  No primary care provider on file.  Electrophysiologist:  None   Referring MD: Valerie Barrack, MD   Chief Complaint  Patient presents with  . Chest Pain    History of Present Illness:    Valerie Gregory is a 50 y.o. female with no significant past medical history who is referred by Dr. Jerline Pain for evaluation of chest pain.  She reports that she had the COVID-19 vaccine in March 2021 and subsequently developed numbness in her face for several weeks.  Following her second vaccine she again had numbness in face but only lasted for short time.  Following her second vaccine she began having chest pain.  Describes as left-sided dull aching pain.  Reports occurred all the time.  Also woke up short of breath a few times.  States that pain gradually resolved but then occured again.  She has had 3 episodes of chest pain over the last 2 weeks.  Thinks it may related to stress but also seems to be brought on by certain foods like eating hummus or chocolate.  States the pain can last for hours.  She walks about 1 mile 5 to 6 days/week and bikes 30 minutes 3 to 4 days/week.  She has not noted pain with exertion, reports she generally feels better when she exercises.  Reports occasional lightheadedness but denies any syncope.  Denies any lower extremity edema.  Reports palpitations after having chocolate or coffee.  Drinks 2 cups of tea per day, does not drink coffee.  No smoking history.  Family history includes mother has CAD and CHF.   Past Medical History:  Diagnosis Date  . Allergy   . Amenorrhea   . Anxiety     Past Surgical History:  Procedure Laterality Date  . ROOT CANAL    . WISDOM TOOTH EXTRACTION      Current Medications: Current Meds  Medication Sig  . Arginine 1000 MG TABS Take 1,000 mg by mouth daily.  . Ascorbic Acid (QC VITAMIN C WITH  ROSE HIPS PO) Take by mouth.  Marland Kitchen b complex vitamins capsule Take 1 capsule by mouth daily.  . cholecalciferol (VITAMIN D3) 25 MCG (1000 UNIT) tablet Take 1,000 Units by mouth daily.  . CO ENZYME Q-10 PO Take by mouth.  . EPINEPHrine 0.3 mg/0.3 mL IJ SOAJ injection Inject 0.3 mg into the muscle once as needed (anaphylaxis/allergic reaction).  . RED CLOVER LEAF EXTRACT PO Take by mouth.  Marland Kitchen UNABLE TO FIND Med Name: Tumeric     Allergies:   Shellfish-derived products, Flublok [influenza vaccine recombinant], and Sulfa antibiotics   Social History   Socioeconomic History  . Marital status: Married    Spouse name: Not on file  . Number of children: Not on file  . Years of education: Not on file  . Highest education level: Not on file  Occupational History  . Not on file  Tobacco Use  . Smoking status: Never Smoker  . Smokeless tobacco: Never Used  Substance and Sexual Activity  . Alcohol use: Yes    Alcohol/week: 1.0 standard drink    Types: 1 Standard drinks or equivalent per week  . Drug use: No  . Sexual activity: Yes    Partners: Male    Birth control/protection: None  Other Topics Concern  . Not on file  Social History  Narrative  . Not on file   Social Determinants of Health   Financial Resource Strain: Not on file  Food Insecurity: Not on file  Transportation Needs: Not on file  Physical Activity: Not on file  Stress: Not on file  Social Connections: Not on file     Family History: The patient's family history includes Diabetes in her paternal aunt; Heart attack (age of onset: 60) in her paternal aunt; Heart attack (age of onset: 59) in her maternal aunt; Hypertension in her maternal aunt; Hyperthyroidism in her father; Multiple sclerosis in her mother; Thyroid disease in her father.  ROS:   Please see the history of present illness.     All other systems reviewed and are negative.  EKGs/Labs/Other Studies Reviewed:    The following studies were reviewed  today:   EKG:  EKG is  ordered today.  The ekg ordered today demonstrates normal sinus rhythm, no ST abnormalities, nonspecific T wave flattening, rate 76  Recent Labs: 08/08/2020: ALT 15; BUN 14; Creat 0.70; Hemoglobin 14.0; Platelets 180; Potassium 4.2; Sodium 143; TSH 0.53  Recent Lipid Panel    Component Value Date/Time   CHOL 191 08/08/2020 1028   TRIG 52 08/08/2020 1028   HDL 75 08/08/2020 1028   CHOLHDL 2.5 08/08/2020 1028   LDLCALC 103 (H) 08/08/2020 1028    Physical Exam:    VS:  BP 134/86 (BP Location: Left Arm, Patient Position: Sitting)   Pulse 81   Ht 5' 7.5" (1.715 m)   Wt 145 lb 3.2 oz (65.9 kg)   SpO2 99%   BMI 22.41 kg/m     Wt Readings from Last 3 Encounters:  10/28/20 145 lb 3.2 oz (65.9 kg)  08/15/20 145 lb (65.8 kg)  08/08/20 142 lb (64.4 kg)     GEN: Well nourished, well developed in no acute distress HEENT: Normal NECK: No JVD; No carotid bruits LYMPHATICS: No lymphadenopathy CARDIAC: RRR, no murmurs, rubs, gallops RESPIRATORY:  Clear to auscultation without rales, wheezing or rhonchi  ABDOMEN: Soft, non-tender, non-distended MUSCULOSKELETAL:  No edema; No deformity  SKIN: Warm and dry NEUROLOGIC:  Alert and oriented x 3 PSYCHIATRIC:  Normal affect   ASSESSMENT:    1. Chest pain of uncertain etiology    PLAN:    Chest pain: Atypical in description.  Low suspicion for obstructive CAD given lack of risk factors and pain is not exertional.  Does report pain started following her COVID-19 vaccination.  Will check echocardiogram to evaluate for evidence of myocarditis.  Will check ESR/CRP  RTC in 3 months   Medication Adjustments/Labs and Tests Ordered: Current medicines are reviewed at length with the patient today.  Concerns regarding medicines are outlined above.  Orders Placed This Encounter  Procedures  . Sedimentation rate  . C-reactive protein  . EKG 12-Lead  . ECHOCARDIOGRAM COMPLETE   No orders of the defined types were  placed in this encounter.   Patient Instructions  Medication Instructions:  Your physician recommends that you continue on your current medications as directed. Please refer to the Current Medication list given to you today.  Lab Work: CRP, ESR today  If you have labs (blood work) drawn today and your tests are completely normal, you will receive your results only by: Marland Kitchen MyChart Message (if you have MyChart) OR . A paper copy in the mail If you have any lab test that is abnormal or we need to change your treatment, we will call you to review the results.  Testing/Procedures: Your physician has requested that you have an echocardiogram. Echocardiography is a painless test that uses sound waves to create images of your heart. It provides your doctor with information about the size and shape of your heart and how well your heart's chambers and valves are working. This procedure takes approximately one hour. There are no restrictions for this procedure.  This will be done at our [facility] location:  [place]: At [facility], you and your health needs are our priority.  As part of our continuing mission to provide you with exceptional heart care, we have created designated Provider Care Teams.  These Care Teams include your primary Cardiologist (physician) and Advanced Practice Providers (APPs -  Physician Assistants and Nurse Practitioners) who all work together to provide you with the care you need, when you need it.  We recommend signing up for the patient portal called "MyChart".  Sign up information is provided on this After Visit Summary.  MyChart is used to connect with patients for Virtual Visits (Telemedicine).  Patients are able to view lab/test results, encounter notes, upcoming appointments, etc.  Non-urgent messages can be sent to your provider as well.   To learn more about what you can do with MyChart, go to [email or web address].    Your  next appointment:   3 month(s)  The format for your next appointment:   In Person  Provider:   Oswaldo Milian, MD         Signed, Donato Heinz, MD  10/30/2020 12:30 AM    [place]

## 2020-10-28 ENCOUNTER — Other Ambulatory Visit: Payer: Self-pay

## 2020-10-28 ENCOUNTER — Ambulatory Visit (INDEPENDENT_AMBULATORY_CARE_PROVIDER_SITE_OTHER): Payer: 59 | Admitting: Cardiology

## 2020-10-28 ENCOUNTER — Ambulatory Visit: Payer: 59 | Admitting: Psychology

## 2020-10-28 ENCOUNTER — Encounter: Payer: Self-pay | Admitting: Cardiology

## 2020-10-28 VITALS — BP 134/86 | HR 81 | Ht 67.5 in | Wt 145.2 lb

## 2020-10-28 DIAGNOSIS — R079 Chest pain, unspecified: Secondary | ICD-10-CM

## 2020-10-28 NOTE — Patient Instructions (Addendum)
Medication Instructions:  Your physician recommends that you continue on your current medications as directed. Please refer to the Current Medication list given to you today.  Lab Work: CRP, ESR today  If you have labs (blood work) drawn today and your tests are completely normal, you will receive your results only by: Marland Kitchen MyChart Message (if you have MyChart) OR . A paper copy in the mail If you have any lab test that is abnormal or we need to change your treatment, we will call you to review the results.   Testing/Procedures: Your physician has requested that you have an echocardiogram. Echocardiography is a painless test that uses sound waves to create images of your heart. It provides your doctor with information about the size and shape of your heart and how well your heart's chambers and valves are working. This procedure takes approximately one hour. There are no restrictions for this procedure.  This will be done at our [facility] location:  [place]: At [facility], you and your health needs are our priority.  As part of our continuing mission to provide you with exceptional heart care, we have created designated Provider Care Teams.  These Care Teams include your primary Cardiologist (physician) and Advanced Practice Providers (APPs -  Physician Assistants and Nurse Practitioners) who all work together to provide you with the care you need, when you need it.  We recommend signing up for the patient portal called "MyChart".  Sign up information is provided on this After Visit Summary.  MyChart is used to connect with patients for Virtual Visits (Telemedicine).  Patients are able to view lab/test results, encounter notes, upcoming appointments, etc.  Non-urgent messages can be sent to your provider as well.   To learn more about what you can do with MyChart, go to [email or web address].    Your next appointment:   3 month(s)  The format for  your next appointment:   In Person  Provider:   Oswaldo Milian, MD

## 2020-10-29 LAB — C-REACTIVE PROTEIN: CRP: <1 mg/L (ref 0–10)

## 2020-10-29 LAB — SEDIMENTATION RATE: Sed Rate: 2 mm/hr (ref 0–40)

## 2020-11-01 ENCOUNTER — Ambulatory Visit (INDEPENDENT_AMBULATORY_CARE_PROVIDER_SITE_OTHER): Payer: 59 | Admitting: Psychology

## 2020-11-01 DIAGNOSIS — F411 Generalized anxiety disorder: Secondary | ICD-10-CM | POA: Diagnosis not present

## 2020-11-06 ENCOUNTER — Ambulatory Visit: Payer: 59 | Admitting: Psychology

## 2020-11-19 ENCOUNTER — Ambulatory Visit (INDEPENDENT_AMBULATORY_CARE_PROVIDER_SITE_OTHER): Payer: 59 | Admitting: Psychology

## 2020-11-19 ENCOUNTER — Other Ambulatory Visit (HOSPITAL_COMMUNITY): Payer: 59

## 2020-11-19 DIAGNOSIS — F411 Generalized anxiety disorder: Secondary | ICD-10-CM

## 2020-11-20 ENCOUNTER — Other Ambulatory Visit (HOSPITAL_COMMUNITY): Payer: 59

## 2020-12-04 ENCOUNTER — Ambulatory Visit (INDEPENDENT_AMBULATORY_CARE_PROVIDER_SITE_OTHER): Payer: 59 | Admitting: Psychology

## 2020-12-04 DIAGNOSIS — F411 Generalized anxiety disorder: Secondary | ICD-10-CM | POA: Diagnosis not present

## 2020-12-09 ENCOUNTER — Ambulatory Visit (HOSPITAL_COMMUNITY): Payer: 59 | Attending: Cardiology

## 2020-12-09 ENCOUNTER — Other Ambulatory Visit: Payer: Self-pay

## 2020-12-09 DIAGNOSIS — R079 Chest pain, unspecified: Secondary | ICD-10-CM

## 2020-12-09 LAB — ECHOCARDIOGRAM COMPLETE
Area-P 1/2: 2.76 cm2
S' Lateral: 2.8 cm

## 2020-12-18 ENCOUNTER — Ambulatory Visit (INDEPENDENT_AMBULATORY_CARE_PROVIDER_SITE_OTHER): Payer: 59 | Admitting: Psychology

## 2020-12-18 DIAGNOSIS — F411 Generalized anxiety disorder: Secondary | ICD-10-CM | POA: Diagnosis not present

## 2020-12-19 ENCOUNTER — Encounter: Payer: 59 | Admitting: Obstetrics and Gynecology

## 2020-12-31 ENCOUNTER — Ambulatory Visit (INDEPENDENT_AMBULATORY_CARE_PROVIDER_SITE_OTHER): Payer: 59 | Admitting: Psychology

## 2020-12-31 DIAGNOSIS — F411 Generalized anxiety disorder: Secondary | ICD-10-CM

## 2021-01-03 ENCOUNTER — Ambulatory Visit: Payer: 59 | Admitting: Cardiology

## 2021-01-06 ENCOUNTER — Ambulatory Visit (INDEPENDENT_AMBULATORY_CARE_PROVIDER_SITE_OTHER): Payer: 59 | Admitting: Cardiology

## 2021-01-06 ENCOUNTER — Encounter: Payer: Self-pay | Admitting: Cardiology

## 2021-01-06 ENCOUNTER — Other Ambulatory Visit: Payer: Self-pay

## 2021-01-06 VITALS — BP 114/77 | HR 72 | Ht 67.5 in | Wt 138.4 lb

## 2021-01-06 DIAGNOSIS — R079 Chest pain, unspecified: Secondary | ICD-10-CM

## 2021-01-06 DIAGNOSIS — E785 Hyperlipidemia, unspecified: Secondary | ICD-10-CM | POA: Diagnosis not present

## 2021-01-06 NOTE — Progress Notes (Signed)
Cardiology Office Note:    Date:  01/06/2021   ID:  Lyman Bishop, DOB 1970-11-01, MRN [id number]  PCP:  Vivi Barrack, MD  Cardiologist:  No primary care provider on file.  Electrophysiologist:  None   Referring MD: Vivi Barrack, MD   Chief Complaint  Patient presents with  . Chest Pain    History of Present Illness:    Valerie Gregory is a 50 y.o. female with no significant past medical history who presents for follow-up.  She was referred by Dr. Jerline Pain for evaluation of chest pain, initially seen on 10/28/2020.  She reports that she had the COVID-19 vaccine in March 2021 and subsequently developed numbness in her face for several weeks.  Following her second vaccine she again had numbness in face but only lasted for short time.  Following her second vaccine she began having chest pain.  Describes as left-sided dull aching pain.  Reports occurred all the time.  Also woke up short of breath a few times.  States that pain gradually resolved but then occured again.  She has had 3 episodes of chest pain over the last 2 weeks.  Thinks it may related to stress but also seems to be brought on by certain foods like eating hummus or chocolate.  States the pain can last for hours.  She walks about 1 mile 5 to 6 days/week and bikes 30 minutes 3 to 4 days/week.  She has not noted pain with exertion, reports she generally feels better when she exercises.  Reports occasional lightheadedness but denies any syncope.  Denies any lower extremity edema.  Reports palpitations after having chocolate or coffee.  Drinks 2 cups of tea per day, does not drink coffee.  No smoking history.  Family history includes mother has CAD and CHF.  Echocardiogram on 12/09/2020 showed normal biventricular function, no significant valvular disease.  Since last clinic visit, she reports that she is doing better.  States that chest pain has improved, now having rarely, about once per month.  She notices it after eating  certain foods like chocolate or drinking tea, especially when she is stressed.  She describes left-sided dull aching pain.  She is riding her bicycle 30 to 45 minutes 5 days/week and walking 1 to 2 miles every day.  She denies any exertional chest pain.  She stopped all caffeine intake and this seems to have helped.  Reports palpitations after eating certain foods.    Past Medical History:  Diagnosis Date  . Allergy   . Amenorrhea   . Anxiety     Past Surgical History:  Procedure Laterality Date  . ROOT CANAL    . WISDOM TOOTH EXTRACTION      Current Medications: Current Meds  Medication Sig  . Ascorbic Acid (QC VITAMIN C WITH ROSE HIPS PO) Take by mouth.  Marland Kitchen b complex vitamins capsule Take 1 capsule by mouth daily.  . cholecalciferol (VITAMIN D3) 25 MCG (1000 UNIT) tablet Take 1,000 Units by mouth daily.  . CO ENZYME Q-10 PO Take by mouth.  . EPINEPHrine 0.3 mg/0.3 mL IJ SOAJ injection Inject 0.3 mg into the muscle once as needed (anaphylaxis/allergic reaction).  . Multiple Vitamin (MULTIVITAMIN) capsule Take 1 capsule by mouth daily.  . RED CLOVER LEAF EXTRACT PO Take by mouth.  Marland Kitchen UNABLE TO FIND Med Name: Tumeric  . [DISCONTINUED] Arginine 1000 MG TABS Take 1,000 mg by mouth daily.     Allergies:   Shellfish-derived products, Flublok [influenza vaccine recombinant],  and Sulfa antibiotics   Social History   Socioeconomic History  . Marital status: Married    Spouse name: Not on file  . Number of children: Not on file  . Years of education: Not on file  . Highest education level: Not on file  Occupational History  . Not on file  Tobacco Use  . Smoking status: Never Smoker  . Smokeless tobacco: Never Used  Substance and Sexual Activity  . Alcohol use: Yes    Alcohol/week: 1.0 standard drink    Types: 1 Standard drinks or equivalent per week  . Drug use: No  . Sexual activity: Yes    Partners: Male    Birth control/protection: None  Other Topics Concern  . Not on  file  Social History Narrative  . Not on file   Social Determinants of Health   Financial Resource Strain: Not on file  Food Insecurity: Not on file  Transportation Needs: Not on file  Physical Activity: Not on file  Stress: Not on file  Social Connections: Not on file     Family History: The patient's family history includes Diabetes in her paternal aunt; Heart attack (age of onset: 54) in her paternal aunt; Heart attack (age of onset: 91) in her maternal aunt; Hypertension in her maternal aunt; Hyperthyroidism in her father; Multiple sclerosis in her mother; Thyroid disease in her father.  ROS:   Please see the history of present illness.     All other systems reviewed and are negative.  EKGs/Labs/Other Studies Reviewed:    The following studies were reviewed today:   EKG:  EKG is  Not ordered today.  The ekg ordered at prior clinic visit demonstrates normal sinus rhythm, no ST abnormalities, nonspecific T wave flattening, rate 76  Recent Labs: 08/08/2020: ALT 15; BUN 14; Creat 0.70; Hemoglobin 14.0; Platelets 180; Potassium 4.2; Sodium 143; TSH 0.53  Recent Lipid Panel    Component Value Date/Time   CHOL 191 08/08/2020 1028   TRIG 52 08/08/2020 1028   HDL 75 08/08/2020 1028   CHOLHDL 2.5 08/08/2020 1028   LDLCALC 103 (H) 08/08/2020 1028    Physical Exam:    VS:  BP 114/77   Pulse 72   Ht 5' 7.5" (1.715 m)   Wt 138 lb 6.4 oz (62.8 kg)   SpO2 100%   BMI 21.36 kg/m     Wt Readings from Last 3 Encounters:  01/06/21 138 lb 6.4 oz (62.8 kg)  10/28/20 145 lb 3.2 oz (65.9 kg)  08/15/20 145 lb (65.8 kg)     GEN: Well nourished, well developed in no acute distress HEENT: Normal NECK: No JVD; No carotid bruits LYMPHATICS: No lymphadenopathy CARDIAC: RRR, no murmurs, rubs, gallops RESPIRATORY:  Clear to auscultation without rales, wheezing or rhonchi  ABDOMEN: Soft, non-tender, non-distended MUSCULOSKELETAL:  No edema; No deformity  SKIN: Warm and  dry NEUROLOGIC:  Alert and oriented x 3 PSYCHIATRIC:  Normal affect   ASSESSMENT:    1. Chest pain of uncertain etiology   2. Hyperlipidemia, unspecified hyperlipidemia type    PLAN:    Chest pain: Atypical in description.  Low suspicion for obstructive CAD given lack of risk factors and pain is not exertional.  Does report pain started following her COVID-19 vaccination.  Echocardiogram on 12/09/2020 showed normal biventricular function, no significant valvular disease. Normal ESR/CRP -Has improved with avoiding certain foods, suspect likely GERD.  No further cardiac work-up recommended at this time  Hyperlipidemia: LDL 103 on 08/08/2020.  10-year ASCVD risk score 1%.  No indication for statin at this time.  Discussed that we could further risk stratify with calcium score given her family history, but would prefer to hold off at this time  RTC in 1 year   Medication Adjustments/Labs and Tests Ordered: Current medicines are reviewed at length with the patient today.  Concerns regarding medicines are outlined above.  No orders of the defined types were placed in this encounter.  No orders of the defined types were placed in this encounter.   Patient Instructions  Medication Instructions:  Your physician recommends that you continue on your current medications as directed. Please refer to the Current Medication list given to you today.  *If you need a refill on your cardiac medications before your next appointment, please call your pharmacy*  Follow-Up: At [facility], you and your health needs are our priority.  As part of our continuing mission to provide you with exceptional heart care, we have created designated Provider Care Teams.  These Care Teams include your primary Cardiologist (physician) and Advanced Practice Providers (APPs -  Physician Assistants and Nurse Practitioners) who all work together to provide you with the care you need, when you need it.  We recommend  signing up for the patient portal called "MyChart".  Sign up information is provided on this After Visit Summary.  MyChart is used to connect with patients for Virtual Visits (Telemedicine).  Patients are able to view lab/test results, encounter notes, upcoming appointments, etc.  Non-urgent messages can be sent to your provider as well.   To learn more about what you can do with MyChart, go to [email or web address].    Your next appointment:   12 month(s)  The format for your next appointment:   In Person  Provider:   Oswaldo Milian, MD         Signed, Donato Heinz, MD  01/06/2021 1:55 PM    [place]

## 2021-01-06 NOTE — Patient Instructions (Signed)

## 2021-01-21 ENCOUNTER — Ambulatory Visit (INDEPENDENT_AMBULATORY_CARE_PROVIDER_SITE_OTHER): Payer: 59 | Admitting: Psychology

## 2021-01-21 DIAGNOSIS — F411 Generalized anxiety disorder: Secondary | ICD-10-CM | POA: Diagnosis not present

## 2021-01-24 ENCOUNTER — Ambulatory Visit: Payer: 59 | Admitting: Psychology

## 2021-01-28 ENCOUNTER — Encounter: Payer: 59 | Admitting: Obstetrics and Gynecology

## 2021-02-04 ENCOUNTER — Ambulatory Visit: Payer: 59 | Admitting: Psychology

## 2021-02-10 ENCOUNTER — Ambulatory Visit (INDEPENDENT_AMBULATORY_CARE_PROVIDER_SITE_OTHER): Payer: 59 | Admitting: Psychology

## 2021-02-10 DIAGNOSIS — F411 Generalized anxiety disorder: Secondary | ICD-10-CM

## 2021-02-19 ENCOUNTER — Ambulatory Visit (INDEPENDENT_AMBULATORY_CARE_PROVIDER_SITE_OTHER): Payer: 59 | Admitting: Psychology

## 2021-02-19 DIAGNOSIS — F411 Generalized anxiety disorder: Secondary | ICD-10-CM | POA: Diagnosis not present

## 2021-02-24 ENCOUNTER — Ambulatory Visit (INDEPENDENT_AMBULATORY_CARE_PROVIDER_SITE_OTHER): Payer: 59 | Admitting: Psychology

## 2021-02-24 DIAGNOSIS — F411 Generalized anxiety disorder: Secondary | ICD-10-CM | POA: Diagnosis not present

## 2021-03-04 ENCOUNTER — Ambulatory Visit (INDEPENDENT_AMBULATORY_CARE_PROVIDER_SITE_OTHER): Payer: 59 | Admitting: Psychology

## 2021-03-04 DIAGNOSIS — F411 Generalized anxiety disorder: Secondary | ICD-10-CM

## 2021-03-12 ENCOUNTER — Ambulatory Visit (INDEPENDENT_AMBULATORY_CARE_PROVIDER_SITE_OTHER): Payer: 59 | Admitting: Psychology

## 2021-03-12 ENCOUNTER — Ambulatory Visit: Payer: 59 | Admitting: Psychology

## 2021-03-12 DIAGNOSIS — F411 Generalized anxiety disorder: Secondary | ICD-10-CM | POA: Diagnosis not present

## 2021-03-13 ENCOUNTER — Encounter: Payer: 59 | Admitting: Obstetrics and Gynecology

## 2021-03-17 ENCOUNTER — Ambulatory Visit (INDEPENDENT_AMBULATORY_CARE_PROVIDER_SITE_OTHER): Payer: 59 | Admitting: Psychology

## 2021-03-17 DIAGNOSIS — F411 Generalized anxiety disorder: Secondary | ICD-10-CM | POA: Diagnosis not present

## 2021-03-18 ENCOUNTER — Other Ambulatory Visit: Payer: Self-pay

## 2021-03-18 ENCOUNTER — Encounter: Payer: Self-pay | Admitting: Obstetrics and Gynecology

## 2021-03-18 ENCOUNTER — Ambulatory Visit (INDEPENDENT_AMBULATORY_CARE_PROVIDER_SITE_OTHER): Payer: 59 | Admitting: Obstetrics and Gynecology

## 2021-03-18 ENCOUNTER — Other Ambulatory Visit (HOSPITAL_COMMUNITY)
Admission: RE | Admit: 2021-03-18 | Discharge: 2021-03-18 | Disposition: A | Discharge status: Home / Self Care | Payer: 59 | Source: Ambulatory Visit | Attending: Obstetrics and Gynecology | Admitting: Obstetrics and Gynecology

## 2021-03-18 VITALS — BP 130/82 | HR 69 | Ht 67.5 in | Wt 139.0 lb

## 2021-03-18 DIAGNOSIS — Z124 Encounter for screening for malignant neoplasm of cervix: Secondary | ICD-10-CM | POA: Diagnosis present

## 2021-03-18 DIAGNOSIS — Z8659 Personal history of other mental and behavioral disorders: Secondary | ICD-10-CM

## 2021-03-18 DIAGNOSIS — L309 Dermatitis, unspecified: Secondary | ICD-10-CM | POA: Diagnosis not present

## 2021-03-18 DIAGNOSIS — Z01419 Encounter for gynecological examination (general) (routine) without abnormal findings: Secondary | ICD-10-CM

## 2021-03-18 MED ORDER — BETAMETHASONE VALERATE 0.1 % EX OINT: TOPICAL_OINTMENT | CUTANEOUS | 1 refills | Status: DC

## 2021-03-18 NOTE — Progress Notes (Signed)
50 y.o. G0P0000 Single Declined Declined female here for annual exam and to establish care. She has not had a period in over a year. She states that she has been told she has a walnut sized fibroid. She has dry skin on her pubic area.   No vaginal bleeding for over a year. No significant vasomotor symptoms. No vaginal dryness. Occasionally sweats at night. Not sexually active currently, considering dating.   She is struggling with depression, seeing a therapist. Not on medication, doesn't want medication. No SI or HI.  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  Not sleeping well.     Patient's last menstrual period was 10/26/2018 (approximate).          Sexually active: No.  The current method of family planning is none.    Exercising: Yes.    walking , biking, yoga Smoker:  no  Health Maintenance: Pap:  07/14/16 WNL HPV Neg  History of abnormal Pap:  no MMG:  2017 normal  BMD:   None  Colonoscopy: none  TDaP:  2017 Gardasil: none    reports that she has never smoked. She has never used smokeless tobacco. She reports current alcohol use of about 1.0 standard drink of alcohol per week. She reports that she does not use drugs.  Past Medical History:  Diagnosis Date  . Allergy   . Amenorrhea   . Anxiety     Past Surgical History:  Procedure Laterality Date  . ROOT CANAL    . WISDOM TOOTH EXTRACTION      Current Outpatient Medications  Medication Sig Dispense Refill  . Ascorbic Acid (QC VITAMIN C WITH ROSE HIPS PO) Take by mouth.    Marland Kitchen b complex vitamins capsule Take 1 capsule by mouth daily.    . cholecalciferol (VITAMIN D3) 25 MCG (1000 UNIT) tablet Take 1,000 Units by mouth daily.    . CO ENZYME Q-10 PO Take by mouth.    . EPINEPHrine 0.3 mg/0.3 mL IJ SOAJ injection Inject 0.3 mg into the muscle once as needed (anaphylaxis/allergic reaction). 2 each 0  . milk thistle 175 MG tablet Take 175 mg by mouth daily.    . Multiple  Vitamin (MULTIVITAMIN) capsule Take 1 capsule by mouth daily.    Marland Kitchen UNABLE TO FIND Med Name: Tumeric     No current facility-administered medications for this visit.    Family History  Problem Relation Age of Onset  . Thyroid disease Father   . Hyperthyroidism Father   . Hypertension Maternal Aunt   . Heart attack Maternal Aunt 80  . Diabetes Paternal Aunt   . Heart attack Paternal Aunt 92  . Multiple sclerosis Mother     Review of Systems  Psychiatric/Behavioral: Positive for dysphoric mood. The patient is nervous/anxious.   All other systems reviewed and are negative.   Exam:   BP 130/82   Pulse 69   Ht 5' 7.5" (1.715 m)   Wt 139 lb (63 kg)   LMP 10/26/2018 (Approximate)   SpO2 100%   BMI 21.45 kg/m   Weight change: @WEIGHTCHANGE @ Height:   Height: 5' 7.5" (171.5 cm)  Ht Readings from Last 3 Encounters:  03/18/21 5' 7.5" (1.715 m)  01/06/21 5' 7.5" (1.715 m)  10/28/20 5' 7.5" (1.715 m)    General appearance: alert, cooperative and appears stated age Head: Normocephalic, without obvious abnormality, atraumatic Neck: no adenopathy, supple, symmetrical, trachea midline and thyroid normal  to inspection and palpation Lungs: clear to auscultation bilaterally Cardiovascular: regular rate and rhythm Breasts: normal appearance, no masses or tenderness Abdomen: soft, non-tender; non distended,  no masses,  no organomegaly Extremities: extremities normal, atraumatic, no cyanosis or edema Skin: Skin color, texture, turgor normal. No rashes or lesions Lymph nodes: Cervical, supraclavicular, and axillary nodes normal. No abnormal inguinal nodes palpated Neurologic: Grossly normal   Pelvic: External genitalia:  Bilateral upper, outer labia majora with scaly erythema.              Urethra:  normal appearing urethra with no masses, tenderness or lesions              Bartholins and Skenes: normal                 Vagina: normal appearing vagina with normal color and discharge, no  lesions              Cervix: no lesions and stenotic               Bimanual Exam:  Uterus:  normal size, contour, position, consistency, mobility, non-tender and retroverted              Adnexa: no mass, fullness, tenderness               Rectovaginal: Confirms               Anus:  normal sphincter tone, no lesions  Gae Dry chaperoned for the exam.  1. Well woman exam Discussed breast self exam Discussed calcium and vit D intake # given to schedule mammogram Options for colon cancer screening reviewed, she will consider  2. Screening for cervical cancer - Cytology - PAP  3. Vulvar dermatitis -Vulvar skin care information given - betamethasone valerate ointment (VALISONE) 0.1 %; Apply a pea sized amount topically BID for up to 2 weeks as needed. Can use it 1-2 x a week on a regular basis. Not for daily, long term use.  Dispense: 30 g; Refill: 1  4. History of depression She is seeing a Social worker, declines medication

## 2021-03-18 NOTE — Patient Instructions (Addendum)
EXERCISE   We recommended that you start or continue a regular exercise program for good health. Physical activity is anything that gets your body moving, some is better than none. The CDC recommends 150 minutes per week of Moderate-Intensity Aerobic Activity and 2 or more days of Muscle Strengthening Activity.  Benefits of exercise are limitless: helps weight loss/weight maintenance, improves mood and energy, helps with depression and anxiety, improves sleep, tones and strengthens muscles, improves balance, improves bone density, protects from chronic conditions such as heart disease, high blood pressure and diabetes and so much more. To learn more visit: https://www.cdc.gov/physicalactivity/index.html  DIET: Good nutrition starts with a healthy diet of fruits, vegetables, whole grains, and lean protein sources. Drink plenty of water for hydration. Minimize empty calories, sodium, sweets. For more information about dietary recommendations visit: https://health.gov/our-work/nutrition-physical-activity/dietary-guidelines and https://www.myplate.gov/  ALCOHOL:  Women should limit their alcohol intake to no more than 7 drinks/beers/glasses of wine (combined, not each!) per week. Moderation of alcohol intake to this level decreases your risk of breast cancer and liver damage.  If you are concerned that you may have a problem, or your friends have told you they are concerned about your drinking, there are many resources to help. A well-known program that is free, effective, and available to all people all over the nation is Alcoholics Anonymous.  Check out this site to learn more: https://www.aa.org/   CALCIUM AND VITAMIN D:  Adequate intake of calcium and Vitamin D are recommended for bone health.  You should be getting between 1000-1200 mg of calcium and 800 units of Vitamin D daily between diet and supplements  PAP SMEARS:  Pap smears, to check for cervical cancer or precancers,  have traditionally been  done yearly, scientific advances have shown that most women can have pap smears less often.  However, every woman still should have a physical exam from her gynecologist every year. It will include a breast check, inspection of the vulva and vagina to check for abnormal growths or skin changes, a visual exam of the cervix, and then an exam to evaluate the size and shape of the uterus and ovaries. We will also provide age appropriate advice regarding health maintenance, like when you should have certain vaccines, screening for sexually transmitted diseases, bone density testing, colonoscopy, mammograms, etc.   MAMMOGRAMS:  All women over 40 years old should have a routine mammogram.   COLON CANCER SCREENING: Now recommend starting at age 45. At this time colonoscopy is not covered for routine screening until 50. There are take home tests that can be done between 45-49.   COLONOSCOPY:  Colonoscopy to screen for colon cancer is recommended for all women at age 50.  We know, you hate the idea of the prep.  We agree, BUT, having colon cancer and not knowing it is worse!!  Colon cancer so often starts as a polyp that can be seen and removed at colonscopy, which can quite literally save your life!  And if your first colonoscopy is normal and you have no family history of colon cancer, most women don't have to have it again for 10 years.  Once every ten years, you can do something that may end up saving your life, right?  We will be happy to help you get it scheduled when you are ready.  Be sure to check your insurance coverage so you understand how much it will cost.  It may be covered as a preventative service at no cost, but you should check   your particular policy.      Breast Self-Awareness Breast self-awareness means being familiar with how your breasts look and feel. It involves checking your breasts regularly and reporting any changes to your health care provider. Practicing breast self-awareness is  important. A change in your breasts can be a sign of a serious medical problem. Being familiar with how your breasts look and feel allows you to find any problems early, when treatment is more likely to be successful. All women should practice breast self-awareness, including women who have had breast implants. How to do a breast self-exam One way to learn what is normal for your breasts and whether your breasts are changing is to do a breast self-exam. To do a breast self-exam: Look for Changes  1. Remove all the clothing above your waist. 2. Stand in front of a mirror in a room with good lighting. 3. Put your hands on your hips. 4. Push your hands firmly downward. 5. Compare your breasts in the mirror. Look for differences between them (asymmetry), such as: ? Differences in shape. ? Differences in size. ? Puckers, dips, and bumps in one breast and not the other. 6. Look at each breast for changes in your skin, such as: ? Redness. ? Scaly areas. 7. Look for changes in your nipples, such as: ? Discharge. ? Bleeding. ? Dimpling. ? Redness. ? A change in position. Feel for Changes Carefully feel your breasts for lumps and changes. It is best to do this while lying on your back on the floor and again while sitting or standing in the shower or tub with soapy water on your skin. Feel each breast in the following way:  Place the arm on the side of the breast you are examining above your head.  Feel your breast with the other hand.  Start in the nipple area and make  inch (2 cm) overlapping circles to feel your breast. Use the pads of your three middle fingers to do this. Apply light pressure, then medium pressure, then firm pressure. The light pressure will allow you to feel the tissue closest to the skin. The medium pressure will allow you to feel the tissue that is a little deeper. The firm pressure will allow you to feel the tissue close to the ribs.  Continue the overlapping circles,  moving downward over the breast until you feel your ribs below your breast.  Move one finger-width toward the center of the body. Continue to use the  inch (2 cm) overlapping circles to feel your breast as you move slowly up toward your collarbone.  Continue the up and down exam using all three pressures until you reach your armpit.  Write Down What You Find  Write down what is normal for each breast and any changes that you find. Keep a written record with breast changes or normal findings for each breast. By writing this information down, you do not need to depend only on memory for size, tenderness, or location. Write down where you are in your menstrual cycle, if you are still menstruating. If you are having trouble noticing differences in your breasts, do not get discouraged. With time you will become more familiar with the variations in your breasts and more comfortable with the exam. How often should I examine my breasts? Examine your breasts every month. If you are breastfeeding, the best time to examine your breasts is after a feeding or after using a breast pump. If you menstruate, the best time to   examine your breasts is 5-7 days after your period is over. During your period, your breasts are lumpier, and it may be more difficult to notice changes. When should I see my health care provider? See your health care provider if you notice:  A change in shape or size of your breasts or nipples.  A change in the skin of your breast or nipples, such as a reddened or scaly area.  Unusual discharge from your nipples.  A lump or thick area that was not there before.  Pain in your breasts.  Anything that concerns you.  http://APA.org/depression-guideline"> https://clinicalkey.com"> http://point-of-care.elsevierperformancemanager.com/skills/"> http://point-of-care.elsevierperformancemanager.com">  Managing Depression, Adult Depression is a mental health condition that affects your  thoughts, feelings, and actions. Being diagnosed with depression can bring you relief if you did not know why you have felt or behaved a certain way. It could also leave you feeling overwhelmed with uncertainty about your future. Preparing yourself to manage your symptoms can help you feel more positive about your future. How to manage lifestyle changes Managing stress Stress is your body's reaction to life changes and events, both good and bad. Stress can add to your feelings of depression. Learning to manage your stress can help lessen your feelings of depression. Try some of the following approaches to reducing your stress (stress reduction techniques):  Listen to music that you enjoy and that inspires you.  Try using a meditation app or take a meditation class.  Develop a practice that helps you connect with your spiritual self. Walk in nature, pray, or go to a place of worship.  Do some deep breathing. To do this, inhale slowly through your nose. Pause at the top of your inhale for a few seconds and then exhale slowly, letting your muscles relax.  Practice yoga to help relax and work your muscles. Choose a stress reduction technique that suits your lifestyle and personality. These techniques take time and practice to develop. Set aside 5-15 minutes a day to do them. Therapists can offer training in these techniques. Other things you can do to manage stress include:  Keeping a stress diary.  Knowing your limits and saying no when you think something is too much.  Paying attention to how you react to certain situations. You may not be able to control everything, but you can change your reaction.  Adding humor to your life by watching funny films or TV shows.  Making time for activities that you enjoy and that relax you.   Medicines Medicines, such as antidepressants, are often a part of treatment for depression.  Talk with your pharmacist or health care provider about all the  medicines, supplements, and herbal products that you take, their possible side effects, and what medicines and other products are safe to take together.  Make sure to report any side effects you may have to your health care provider. Relationships Your health care provider may suggest family therapy, couples therapy, or individual therapy as part of your treatment. How to recognize changes Everyone responds differently to treatment for depression. As you recover from depression, you may start to:  Have more interest in doing activities.  Feel less hopeless.  Have more energy.  Overeat less often, or have a better appetite.  Have better mental focus. It is important to recognize if your depression is not getting better or is getting worse. The symptoms you had in the beginning may return, such as:  Tiredness (fatigue) or low energy.  Eating too much or too little.  Sleeping too much or too little.  Feeling restless, agitated, or hopeless.  Trouble focusing or making decisions.  Unexplained physical complaints.  Feeling irritable, angry, or aggressive. If you or your family members notice these symptoms coming back, let your health care provider know right away. Follow these instructions at home: Activity  Try to get some form of exercise each day, such as walking, biking, swimming, or lifting weights.  Practice stress reduction techniques.  Engage your mind by taking a class or doing some volunteer work.   Lifestyle  Get the right amount and quality of sleep.  Cut down on using caffeine, tobacco, alcohol, and other potentially harmful substances.  Eat a healthy diet that includes plenty of vegetables, fruits, whole grains, low-fat dairy products, and lean protein. Do not eat a lot of foods that are high in solid fats, added sugars, or salt (sodium). General instructions  Take over-the-counter and prescription medicines only as told by your health care  provider.  Keep all follow-up visits as told by your health care provider. This is important. Where to find support Talking to others Friends and family members can be sources of support and guidance. Talk to trusted friends or family members about your condition. Explain your symptoms to them, and let them know that you are working with a health care provider to treat your depression. Tell friends and family members how they also can be helpful.   Finances  Find appropriate mental health providers that fit with your financial situation.  Talk with your health care provider about options to get reduced prices on your medicines. Where to find more information You can find support in your area from:  Anxiety and Depression Association of America (ADAA): www.adaa.org  Mental Health America: www.mentalhealthamerica.net  [employer] on Mental Illness: www.nami.org Contact a health care provider if:  You stop taking your antidepressant medicines, and you have any of these symptoms: ? Nausea. ? Headache. ? Light-headedness. ? Chills and body aches. ? Not being able to sleep (insomnia).  You or your friends and family think your depression is getting worse. Get help right away if:  You have thoughts of hurting yourself or others. If you ever feel like you may hurt yourself or others, or have thoughts about taking your own life, get help right away. Go to your nearest emergency department or:  Call your local emergency services (911 in the [place]  Call a suicide crisis helpline, such as the [place] at 2511963731. This is open 24 hours a day in the [place]  Text the Crisis Text Line at (901) 494-5112 (in the [place].). Summary  If you are diagnosed with depression, preparing yourself to manage your symptoms is a good way to feel positive about your future.  Work with your health care provider on a management plan that includes stress reduction techniques,  medicines (if applicable), therapy, and healthy lifestyle habits.  Keep talking with your health care provider about how your treatment is working.  If you have thoughts about taking your own life, call a suicide crisis helpline or text a crisis text line. This information is not intended to replace advice given to you by your health care provider. Make sure you discuss any questions you have with your health care provider. Document Revised: 08/23/2019 Document Reviewed: 08/23/2019 Elsevier Patient Education  2021 [employer].

## 2021-03-19 LAB — CYTOLOGY - PAP
Comment: NEGATIVE
Diagnosis: NEGATIVE
High risk HPV: NEGATIVE

## 2021-04-01 ENCOUNTER — Telehealth: Payer: Self-pay | Admitting: *Deleted

## 2021-04-01 NOTE — Telephone Encounter (Signed)
Patient called back stating she would like to start on medication to help with vaginal dryness, I explained to patient she will need to schedule office visit with provider to discuss. Sent appointments a message to schedule.

## 2021-04-01 NOTE — Telephone Encounter (Signed)
Patient called and left message in triage requesting a call back regarding pap results. I called however patient voicemail is not set up to leave a message.

## 2021-04-02 ENCOUNTER — Ambulatory Visit: Payer: 59 | Admitting: Psychology

## 2021-04-03 ENCOUNTER — Other Ambulatory Visit: Payer: Self-pay | Admitting: Obstetrics and Gynecology

## 2021-04-03 ENCOUNTER — Other Ambulatory Visit: Payer: Self-pay

## 2021-04-03 ENCOUNTER — Encounter: Payer: Self-pay | Admitting: Obstetrics and Gynecology

## 2021-04-03 ENCOUNTER — Ambulatory Visit (INDEPENDENT_AMBULATORY_CARE_PROVIDER_SITE_OTHER): Payer: 59 | Admitting: Obstetrics and Gynecology

## 2021-04-03 VITALS — BP 110/64 | HR 77 | Ht 67.5 in | Wt 137.0 lb

## 2021-04-03 DIAGNOSIS — N898 Other specified noninflammatory disorders of vagina: Secondary | ICD-10-CM

## 2021-04-03 DIAGNOSIS — N952 Postmenopausal atrophic vaginitis: Secondary | ICD-10-CM | POA: Diagnosis not present

## 2021-04-03 MED ORDER — ESTRADIOL 10 MCG VA TABS: ORAL_TABLET | VAGINAL | 0 refills | Status: DC

## 2021-04-03 NOTE — Progress Notes (Signed)
GYNECOLOGY  VISIT   HPI: 50 y.o.   Single Declined Declined  female   G0P0000 with No LMP recorded.   here for vaginal dryness. She is concerned that her pap results showed vaginal atrophy.  She has questions about vaginal atrophy. She has noticed some vaginal dryness and some vaginal tightness since her last visit a few weeks ago. She has had some baseline vaginal dryness. Independent sexual activity has been uncomfortable even with a lubricant. She wants to retain her ability to have intercourse.   GYNECOLOGIC HISTORY: No LMP recorded. Contraception:none  Menopausal hormone therapy: none         OB History     Gravida  0   Para  0   Term  0   Preterm  0   AB  0   Living  0      SAB  0   IAB  0   Ectopic  0   Multiple  0   Live Births  0              Patient Active Problem List   Diagnosis Date Noted   Other fatigue 08/08/2020   Gastrointestinal food sensitivity 08/08/2020    Past Medical History:  Diagnosis Date   Allergy    Amenorrhea    Anxiety    Fibroid     Past Surgical History:  Procedure Laterality Date   ROOT CANAL     WISDOM TOOTH EXTRACTION      Current Outpatient Medications  Medication Sig Dispense Refill   Ascorbic Acid (QC VITAMIN C WITH ROSE HIPS PO) Take by mouth.     b complex vitamins capsule Take 1 capsule by mouth daily.     betamethasone valerate ointment (VALISONE) 0.1 % Apply a pea sized amount topically BID for up to 2 weeks as needed. Can use it 1-2 x a week on a regular basis. Not for daily, long term use. 30 g 1   cholecalciferol (VITAMIN D3) 25 MCG (1000 UNIT) tablet Take 1,000 Units by mouth daily.     CO ENZYME Q-10 PO Take by mouth.     EPINEPHrine 0.3 mg/0.3 mL IJ SOAJ injection Inject 0.3 mg into the muscle once as needed (anaphylaxis/allergic reaction). 2 each 0   milk thistle 175 MG tablet Take 175 mg by mouth daily.     Multiple Vitamin (MULTIVITAMIN) capsule Take 1 capsule by mouth daily.     UNABLE TO  FIND Med Name: Tumeric     No current facility-administered medications for this visit.     ALLERGIES: Shellfish-derived products, Flublok [influenza vaccine recombinant], and Sulfa antibiotics  Family History  Problem Relation Age of Onset   Thyroid disease Father    Hyperthyroidism Father    Hypertension Maternal Aunt    Heart attack Maternal Aunt 80   Diabetes Paternal Aunt    Heart attack Paternal Aunt 38   Multiple sclerosis Mother     Social History   Socioeconomic History   Marital status: Single    Spouse name: Not on file   Number of children: Not on file   Years of education: Not on file   Highest education level: Not on file  Occupational History   Not on file  Tobacco Use   Smoking status: Never   Smokeless tobacco: Never  Vaping Use   Vaping Use: Never used  Substance and Sexual Activity   Alcohol use: Yes    Alcohol/week: 1.0 standard drink  Types: 1 Standard drinks or equivalent per week   Drug use: No   Sexual activity: Yes    Partners: Male    Birth control/protection: None  Other Topics Concern   Not on file  Social History Narrative   Not on file   Social Determinants of Health   Financial Resource Strain: Not on file  Food Insecurity: Not on file  Transportation Needs: Not on file  Physical Activity: Not on file  Stress: Not on file  Social Connections: Not on file  Intimate Partner Violence: Not on file    Review of Systems  All other systems reviewed and are negative.  PHYSICAL EXAMINATION:    There were no vitals taken for this visit.    General appearance: alert, cooperative and appears stated age  30. Vaginal atrophy C/o dryness, discomfort even with lubrication - Estradiol 10 MCG TABS vaginal tablet; Place one tablet vaginally every night for one week, then change to 2 x a week at hs.  Dispense: 24 tablet; Refill: 0  2. Vaginal dryness Reviewed over the counter options. She is interested in vaginal estrogen, no  contraindications. Risks reviewed.  -She is over due for a mammogram - Estradiol 10 MCG TABS vaginal tablet; Place one tablet vaginally every night for one week, then change to 2 x a week at hs.  Dispense: 24 tablet; Refill: 0 -Once she has a normal mammogram will refill her vaginal estrogen script.

## 2021-04-04 NOTE — Telephone Encounter (Signed)
PA done with [facility] via phone for the below. PA # [id number] information sent will wait for response can be 24-72 turn over time for response. Patient aware as well.

## 2021-04-05 ENCOUNTER — Ambulatory Visit (INDEPENDENT_AMBULATORY_CARE_PROVIDER_SITE_OTHER): Payer: 59 | Admitting: Psychology

## 2021-04-05 DIAGNOSIS — F411 Generalized anxiety disorder: Secondary | ICD-10-CM

## 2021-04-09 ENCOUNTER — Telehealth: Payer: Self-pay | Admitting: *Deleted

## 2021-04-09 NOTE — Telephone Encounter (Signed)
PA done with [facility] via phone for the below. PA # [id number] information sent will wait for response can be 24-72 turn over time for response. Patient aware as well.

## 2021-04-09 NOTE — Telephone Encounter (Signed)
patient [employer] send fax response back stating medication is denied patient will need to try and fail formulary drugs listed such as estradiol vaginal cream 0.01% for estring these 2 are the covered/paid for medication.   Unable to relay to patient her voicemail is not set up.

## 2021-04-10 NOTE — Telephone Encounter (Signed)
Called patient again today and voicemail still no set up.

## 2021-04-15 ENCOUNTER — Ambulatory Visit (INDEPENDENT_AMBULATORY_CARE_PROVIDER_SITE_OTHER): Payer: 59 | Admitting: Psychology

## 2021-04-15 DIAGNOSIS — F411 Generalized anxiety disorder: Secondary | ICD-10-CM

## 2021-04-17 ENCOUNTER — Ambulatory Visit: Payer: 59 | Admitting: Psychology

## 2021-04-17 ENCOUNTER — Other Ambulatory Visit: Payer: Self-pay | Admitting: Obstetrics and Gynecology

## 2021-04-17 DIAGNOSIS — N952 Postmenopausal atrophic vaginitis: Secondary | ICD-10-CM

## 2021-04-17 DIAGNOSIS — N898 Other specified noninflammatory disorders of vagina: Secondary | ICD-10-CM

## 2021-04-18 NOTE — Telephone Encounter (Signed)
Needs prior auth done

## 2021-04-22 ENCOUNTER — Ambulatory Visit (INDEPENDENT_AMBULATORY_CARE_PROVIDER_SITE_OTHER): Payer: 59 | Admitting: Psychology

## 2021-04-22 DIAGNOSIS — F411 Generalized anxiety disorder: Secondary | ICD-10-CM | POA: Diagnosis not present

## 2021-04-23 NOTE — Telephone Encounter (Signed)
Called patient again and she picked up and I explained the below. Patient said she may buy the estradiol 10 mcg tablets I did tell her could use [facility] to see it this would help cost. Patient will follow up and let me know.

## 2021-04-29 ENCOUNTER — Ambulatory Visit (INDEPENDENT_AMBULATORY_CARE_PROVIDER_SITE_OTHER): Payer: 59 | Admitting: Psychology

## 2021-04-29 DIAGNOSIS — F411 Generalized anxiety disorder: Secondary | ICD-10-CM

## 2021-04-30 ENCOUNTER — Telehealth: Payer: Self-pay | Admitting: *Deleted

## 2021-04-30 NOTE — Telephone Encounter (Signed)
Patient called [employer] insurance will not cover estradiol 10 mcg tablet, patient will need to try and fail formulary drugs listed such as estradiol vaginal cream 0.01% for estring these 2 are the covered/paid for medication. Patient said she would prefer to try the estradiol 0.01% cream. Please advise

## 2021-05-01 MED ORDER — ESTRADIOL 0.1 MG/GM VA CREA: TOPICAL_CREAM | VAGINAL | 0 refills | Status: DC

## 2021-05-01 NOTE — Telephone Encounter (Signed)
Estrace cream sent. The patient needs a mammogram prior to any refills.

## 2021-05-02 NOTE — Telephone Encounter (Signed)
Patient voicemail is full unable to relay message.

## 2021-05-05 ENCOUNTER — Ambulatory Visit (INDEPENDENT_AMBULATORY_CARE_PROVIDER_SITE_OTHER): Payer: 59 | Admitting: Psychology

## 2021-05-05 DIAGNOSIS — F411 Generalized anxiety disorder: Secondary | ICD-10-CM

## 2021-05-06 MED ORDER — ESTRADIOL 0.1 MG/GM VA CREA: TOPICAL_CREAM | VAGINAL | 0 refills | Status: DC

## 2021-05-06 NOTE — Telephone Encounter (Signed)
Patient called again, I told her about the below, she asked if Rx could be sent to [facility] [place]. Rx sent.

## 2021-05-06 NOTE — Telephone Encounter (Signed)
My chart message sent patient voicemail not set up.

## 2021-05-08 ENCOUNTER — Ambulatory Visit: Payer: 59 | Admitting: Psychology

## 2021-05-13 ENCOUNTER — Ambulatory Visit: Payer: 59 | Admitting: Psychology

## 2021-05-20 ENCOUNTER — Ambulatory Visit: Payer: 59 | Admitting: Psychology

## 2021-07-25 IMAGING — US US SOFT TISSUE HEAD/NECK
1 series · 10 of 10 positions shown · non-contrast
Comparison: None.

CLINICAL DATA: Tender palpable area involving the mid posterior
neck for the past several months.

EXAM:
ULTRASOUND OF HEAD/NECK SOFT TISSUES
TECHNIQUE: Ultrasound examination of the head and neck soft tissues was
performed in the area of clinical concern.

[Series 1: us soft tissue head/neck · 0.05mm/px · 10 acquisitions, 10 frames shown]
[im 1/10]
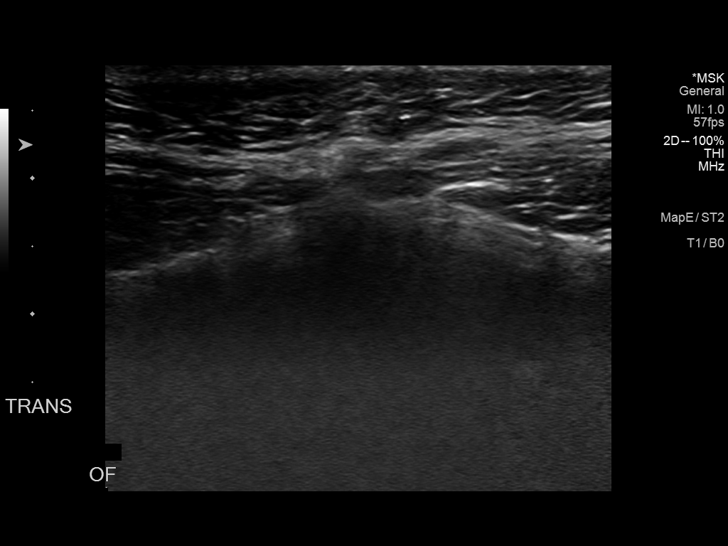
[im 2/10]
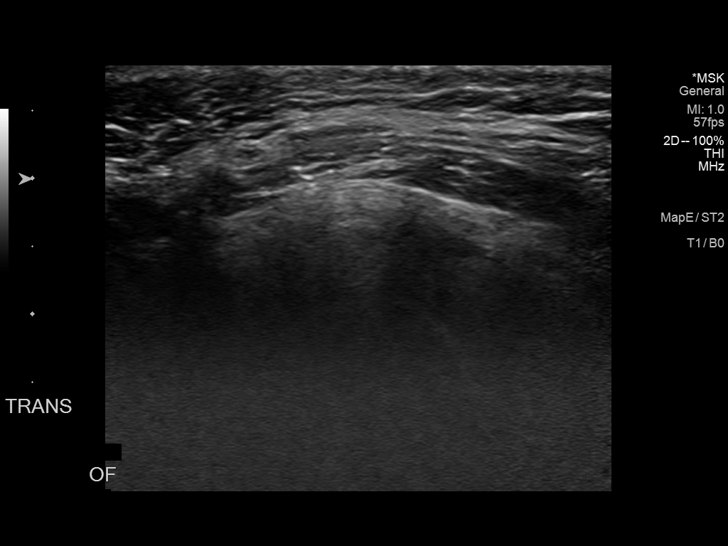
[im 3/10]
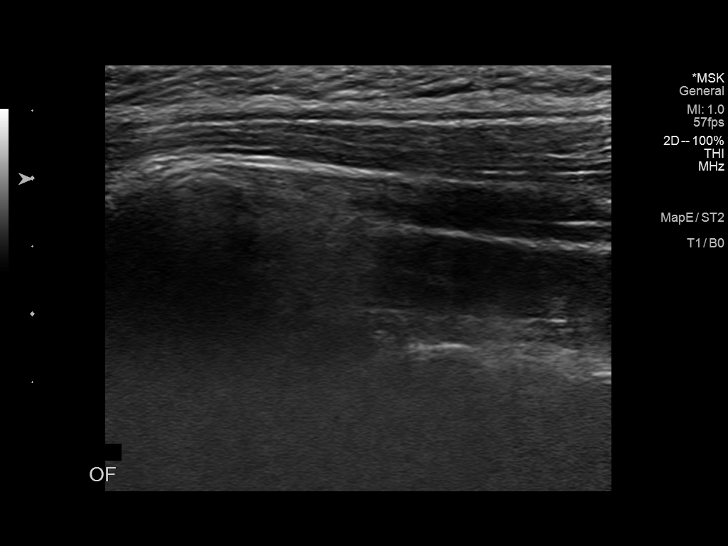
[im 4/10]
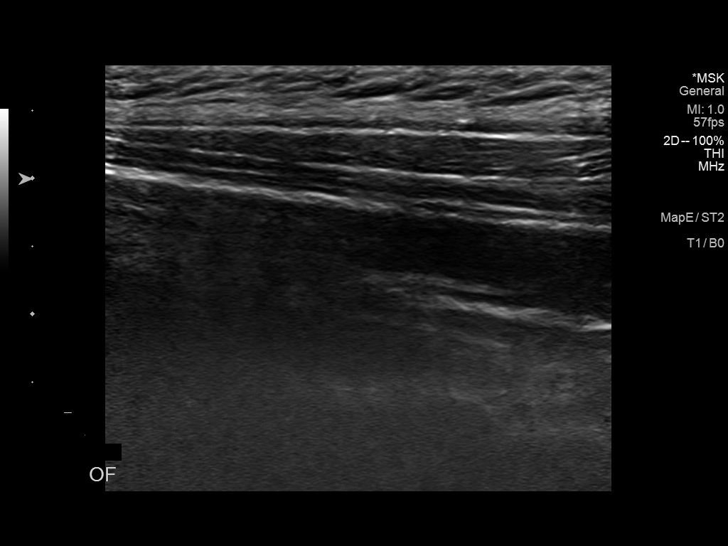
[im 5/10]
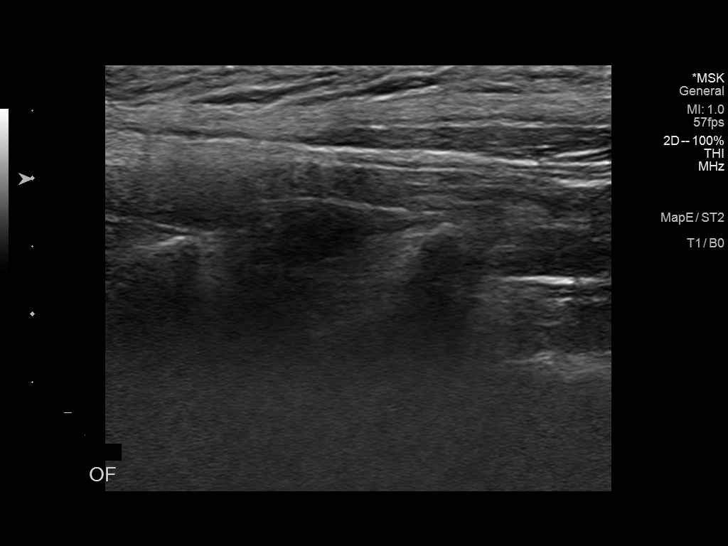
[im 6/10]
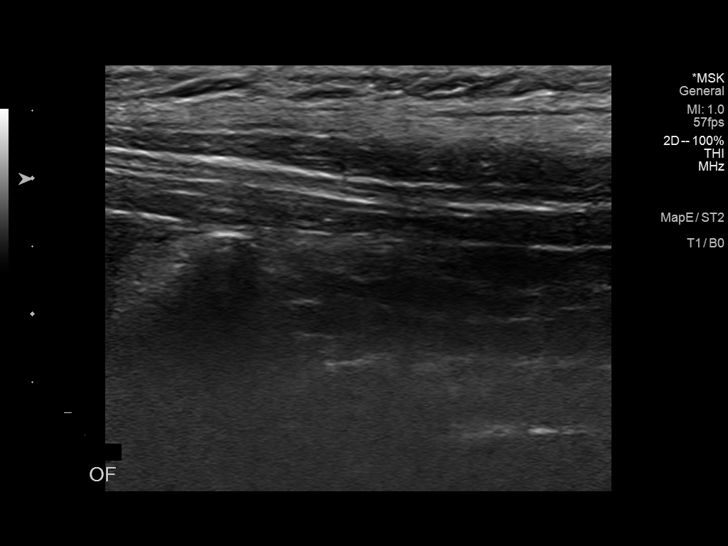
[im 7/10]
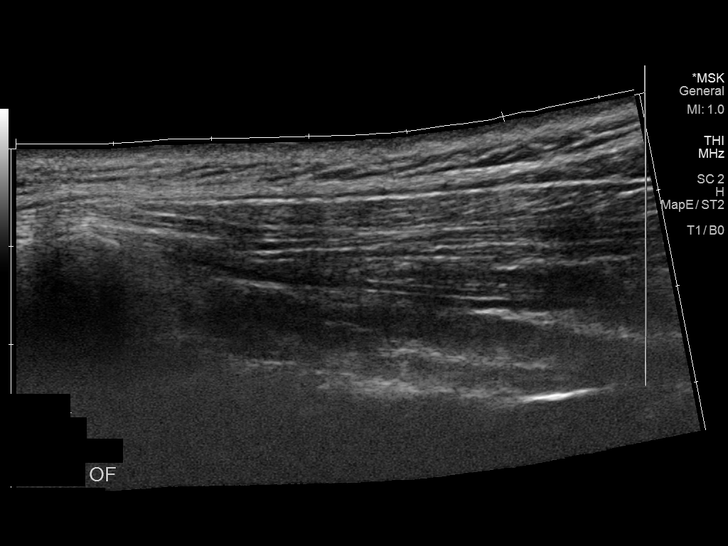
[im 8/10]
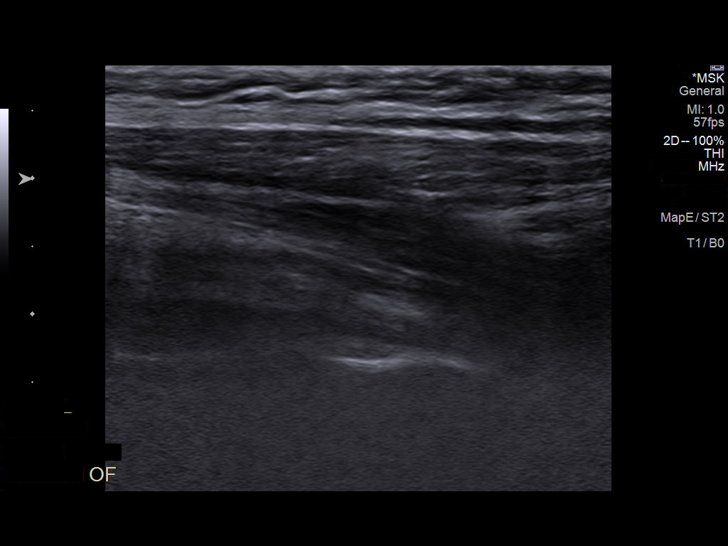
[im 9/10]
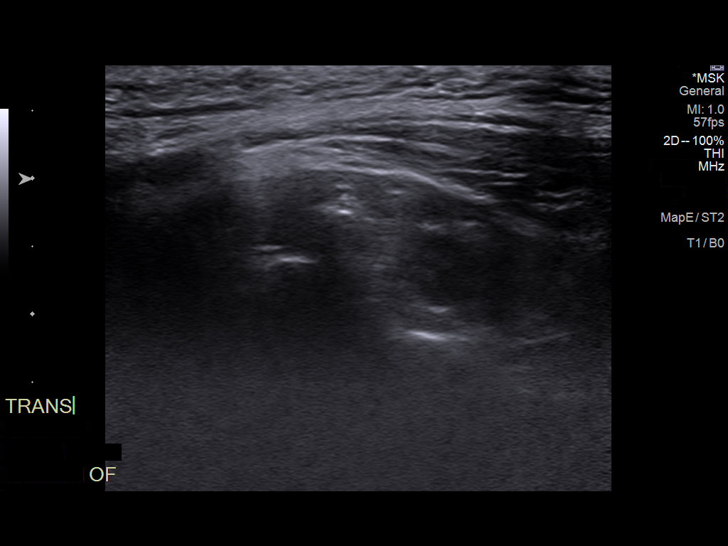
[im 10/10]
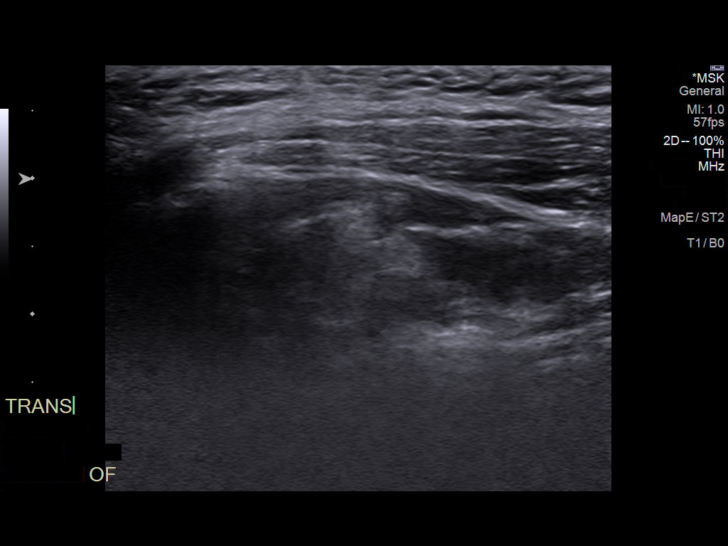

[10 of 10 positions shown; findings below may reference images not displayed]

FINDINGS: Sonographic evaluation of the patient's palpable area of concern
involving the midline of the posterior aspect of the neck is
negative for definitive sonographic correlate. Specifically, no
regional cervical lymphadenopathy. No discrete solid or cystic
lesion.
IMPRESSION: No sonographic correlate for patient's palpable area of concern
within the midline of the posterior aspect of the neck.

## 2021-08-04 ENCOUNTER — Other Ambulatory Visit: Payer: Self-pay | Admitting: Obstetrics and Gynecology

## 2021-08-04 DIAGNOSIS — Z1231 Encounter for screening mammogram for malignant neoplasm of breast: Secondary | ICD-10-CM

## 2021-08-05 ENCOUNTER — Ambulatory Visit (INDEPENDENT_AMBULATORY_CARE_PROVIDER_SITE_OTHER): Payer: 59 | Admitting: Nurse Practitioner

## 2021-08-05 ENCOUNTER — Other Ambulatory Visit: Payer: Self-pay

## 2021-08-05 ENCOUNTER — Encounter: Payer: Self-pay | Admitting: Nurse Practitioner

## 2021-08-05 VITALS — BP 126/74 | HR 88 | Resp 14 | Ht 67.5 in | Wt 140.0 lb

## 2021-08-05 DIAGNOSIS — N898 Other specified noninflammatory disorders of vagina: Secondary | ICD-10-CM

## 2021-08-05 DIAGNOSIS — R300 Dysuria: Secondary | ICD-10-CM | POA: Diagnosis not present

## 2021-08-05 DIAGNOSIS — R109 Unspecified abdominal pain: Secondary | ICD-10-CM | POA: Diagnosis not present

## 2021-08-05 DIAGNOSIS — B3731 Acute candidiasis of vulva and vagina: Secondary | ICD-10-CM

## 2021-08-05 DIAGNOSIS — L309 Dermatitis, unspecified: Secondary | ICD-10-CM

## 2021-08-05 LAB — URINALYSIS, COMPLETE W/RFL CULTURE
Bacteria, UA: NONE SEEN /HPF
Bilirubin Urine: NEGATIVE
Casts: NONE SEEN /LPF
Crystals: NONE SEEN /HPF
Glucose, UA: NEGATIVE
Hgb urine dipstick: NEGATIVE
Hyaline Cast: NONE SEEN /LPF
Ketones, ur: NEGATIVE
Leukocyte Esterase: NEGATIVE
Nitrites, Initial: NEGATIVE
Protein, ur: NEGATIVE
RBC / HPF: NONE SEEN /HPF (ref 0–2)
Specific Gravity, Urine: 1.002 (ref 1.001–1.035)
WBC, UA: NONE SEEN /HPF (ref 0–5)
Yeast: NONE SEEN /HPF
pH: 5.5 (ref 5.0–8.0)

## 2021-08-05 LAB — NO CULTURE INDICATED

## 2021-08-05 LAB — WET PREP FOR TRICH, YEAST, CLUE

## 2021-08-05 MED ORDER — TERCONAZOLE 0.8 % VA CREA: 1.0000 | TOPICAL_CREAM | Freq: Every day | VAGINAL | 0 refills | Status: DC

## 2021-08-05 NOTE — Progress Notes (Signed)
   Acute Office Visit  Subjective:    Patient ID: Valerie Gregory, female    DOB: 05/05/1971, 50 y.o.   MRN: [id number]   HPI 50 y.o. presents today for pelvic pressure and cramping, burning with urination, and vaginal odor that started a couple of weeks ago. She also has a patch of dry, itchy skin on pubic area that is bothersome. She was prescribed betamethasone ointment for this but has not started.    Review of Systems  Constitutional: Negative.   Gastrointestinal:  Positive for abdominal pain (lower abdominal cramping).  Genitourinary:  Positive for frequency and pelvic pain (pressure/cramping). Negative for difficulty urinating, dysuria, hematuria, vaginal discharge and vaginal pain.       Vaginal odor      Objective:    Physical Exam Constitutional:      Appearance: Normal appearance.  Genitourinary:    General: Normal vulva.     Vagina: Erythema present. No vaginal discharge.     Cervix: Normal.    BP 126/74 (BP Location: Right Arm, Patient Position: Sitting, Cuff Size: Normal)   Pulse 88   Resp 14   Ht 5' 7.5" (1.715 m)   Wt 140 lb (63.5 kg)   LMP 10/26/2018 (Approximate)   BMI 21.60 kg/m  Wt Readings from Last 3 Encounters:  08/05/21 140 lb (63.5 kg)  04/03/21 137 lb (62.1 kg)  03/18/21 139 lb (63 kg)   Wet prep + yeast UA negative     Assessment & Plan:   Problem List Items Addressed This Visit   None Visit Diagnoses     Vaginal candidiasis    -  Primary   Relevant Medications   terconazole (TERAZOL 3) 0.8 % vaginal cream   Abdominal cramping       Vaginal odor       Relevant Orders   WET PREP FOR TRICH, YEAST, CLUE   Burning with urination       Relevant Orders   Urinalysis,Complete w/RFL Culture (Completed)   Vulvar dermatitis          Plan: Wet prep positive for yeast - Terazol 3 provided. UA negative. Will return if abdominal discomfort continues after treatment. Recommend starting ointment that was prescribed in [facility] for dry patch on  pubic area. She is agreeable to plan.      Tamela Gammon DNP, 11:38 AM 08/05/2021

## 2021-08-08 ENCOUNTER — Other Ambulatory Visit: Payer: Self-pay

## 2021-08-08 ENCOUNTER — Encounter: Payer: Self-pay | Admitting: Physician Assistant

## 2021-08-08 ENCOUNTER — Telehealth: Payer: Self-pay | Admitting: *Deleted

## 2021-08-08 ENCOUNTER — Ambulatory Visit (INDEPENDENT_AMBULATORY_CARE_PROVIDER_SITE_OTHER): Payer: 59 | Admitting: Physician Assistant

## 2021-08-08 VITALS — BP 102/70 | HR 83 | Temp 97.2°F | Wt 140.4 lb

## 2021-08-08 DIAGNOSIS — Z1211 Encounter for screening for malignant neoplasm of colon: Secondary | ICD-10-CM | POA: Diagnosis not present

## 2021-08-08 DIAGNOSIS — R5383 Other fatigue: Secondary | ICD-10-CM

## 2021-08-08 DIAGNOSIS — R102 Pelvic and perineal pain: Secondary | ICD-10-CM

## 2021-08-08 DIAGNOSIS — R399 Unspecified symptoms and signs involving the genitourinary system: Secondary | ICD-10-CM | POA: Diagnosis not present

## 2021-08-08 LAB — CBC WITH DIFFERENTIAL/PLATELET
Basophils Absolute: 0 10*3/uL (ref 0.0–0.1)
Basophils Relative: 1 % (ref 0.0–3.0)
Eosinophils Absolute: 0.1 10*3/uL (ref 0.0–0.7)
Eosinophils Relative: 1.4 % (ref 0.0–5.0)
HCT: 39.9 % (ref 36.0–46.0)
Hemoglobin: 13.7 g/dL (ref 12.0–15.0)
Lymphocytes Relative: 40.2 % (ref 12.0–46.0)
Lymphs Abs: 1.5 10*3/uL (ref 0.7–4.0)
MCHC: 34.4 g/dL (ref 30.0–36.0)
MCV: 86.7 fl (ref 78.0–100.0)
Monocytes Absolute: 0.3 10*3/uL (ref 0.1–1.0)
Monocytes Relative: 7.6 % (ref 3.0–12.0)
Neutro Abs: 1.9 10*3/uL (ref 1.4–7.7)
Neutrophils Relative %: 49.8 % (ref 43.0–77.0)
Platelets: 169 10*3/uL (ref 150.0–400.0)
RBC: 4.6 Mil/uL (ref 3.87–5.11)
RDW: 13.2 % (ref 11.5–15.5)
WBC: 3.7 10*3/uL — ABNORMAL LOW (ref 4.0–10.5)

## 2021-08-08 LAB — POCT URINALYSIS DIPSTICK
Bilirubin, UA: NEGATIVE
Blood, UA: NEGATIVE
Glucose, UA: NEGATIVE
Ketones, UA: NEGATIVE
Leukocytes, UA: NEGATIVE
Nitrite, UA: NEGATIVE
Protein, UA: NEGATIVE
Spec Grav, UA: <=1.005 — AB (ref 1.010–1.025)
Urobilinogen, UA: 0.2 E.U./dL
pH, UA: 6 (ref 5.0–8.0)

## 2021-08-08 LAB — COMPREHENSIVE METABOLIC PANEL
ALT: 14 U/L (ref 0–35)
AST: 22 U/L (ref 0–37)
Albumin: 4.4 g/dL (ref 3.5–5.2)
Alkaline Phosphatase: 56 U/L (ref 39–117)
BUN: 15 mg/dL (ref 6–23)
CO2: 27 mEq/L (ref 19–32)
Calcium: 9.5 mg/dL (ref 8.4–10.5)
Chloride: 105 mEq/L (ref 96–112)
Creatinine, Ser: 0.75 mg/dL (ref 0.40–1.20)
GFR: 91.36 mL/min (ref >60.00)
Glucose, Bld: 92 mg/dL (ref 70–99)
Potassium: 3.8 mEq/L (ref 3.5–5.1)
Sodium: 140 mEq/L (ref 135–145)
Total Bilirubin: 1.4 mg/dL — ABNORMAL HIGH (ref 0.2–1.2)
Total Protein: 6.9 g/dL (ref 6.0–8.3)

## 2021-08-08 LAB — TSH: TSH: 0.91 u[IU]/mL (ref 0.35–5.50)

## 2021-08-08 NOTE — Telephone Encounter (Signed)
Call returned to patient.  Patient left message stating she was seen in office on 08/05/21, pain and discomfort have not resolved. Patient requesting urine culture.   Per review of OV notes and lab, UA and culture completed on 08/05/21, neg.   Call returned to patient, no answer, voicemail full, unable to leave message.   Routing to [employer] Triage

## 2021-08-08 NOTE — Patient Instructions (Signed)
It was great to see you!  We will update basic blood work today  Urine culture has been ordered  I have also put in a pelvic ultrasound order Please call [phone] next week to schedule  I will ask my GI colleague about the colonoscopy  Take care,  Inda Coke PA-C

## 2021-08-08 NOTE — Progress Notes (Signed)
Valerie Gregory is a 50 y.o. female here for a follow up of a pre-existing problem.  History of Present Illness:   Chief Complaint  Patient presents with   Urinary Tract Infection    Symptoms started several weeks ago. Discomfort, pain, and frequency. OB/GYN did a dipstick that was normal, but no culture     HPI  UTI symptoms; fatigue; pelvic pain Pelvic pressure, cramping, burning with urination that started a couple of weeks. She saw ob-gyn 08/05/21 and had normal UA. Vaginal swab at that time was positive for yeast, she was given topical terconazole but she has not used this yet.  Complains oral Diflucan because she did not consistent with medication for this.  She is concerned because she read online that in-office urinalysis labs can be 50% inaccurate and a urine culture was not performed.  She is asking for a urine culture today.  She did have some right lower back pain at one point, but this is overall resolved.  Denies history of kidney stones.  Has had pronounced fatigue over the week.  She reports that she has baseline fatigue but it has worsened with time.  Had subjective fever on Wednesday. Took COVID test that was negative.  No sick contacts. Over the past month has not been drinking enough water, drinks green tea instead. No changes to sleep pattern. Having urination in the night x 1. No constipation/diarrhea/rectal bleeding.  Stopped having periods about a year ago.  She has not had any bleeding since.  She reports that she has had very minimal menopausal symptoms.  Denies any recent significant weight changes.  Her abdominal cramping is intermittent and not related to anything in particular that she is doing or eating.  Does not feel like constipation is playing a role.  Does have a history of ovarian cyst but is not sure which side, this was about 10 years ago.  Wt Readings from Last 4 Encounters:  08/08/21 140 lb 6.4 oz (63.7 kg)  08/05/21 140 lb (63.5 kg)  04/03/21 137 lb  (62.1 kg)  03/18/21 139 lb (63 kg)     Past Medical History:  Diagnosis Date   Allergy    Amenorrhea    Anxiety    Fibroid      Social History   Tobacco Use   Smoking status: Never   Smokeless tobacco: Never  Vaping Use   Vaping Use: Never used  Substance Use Topics   Alcohol use: Yes    Alcohol/week: 1.0 standard drink    Types: 1 Standard drinks or equivalent per week   Drug use: No    Past Surgical History:  Procedure Laterality Date   ROOT CANAL     WISDOM TOOTH EXTRACTION      Family History  Problem Relation Age of Onset   Thyroid disease Father    Hyperthyroidism Father    Hypertension Maternal Aunt    Heart attack Maternal Aunt 80   Diabetes Paternal Aunt    Heart attack Paternal Aunt 74   Multiple sclerosis Mother     Allergies  Allergen Reactions   Shellfish-Derived Products Anaphylaxis, Hives and Swelling   Flublok [Influenza Vaccine Recombinant]    Sulfa Antibiotics Hives    Current Medications:   Current Outpatient Medications:    Ascorbic Acid (QC VITAMIN C WITH ROSE HIPS PO), Take by mouth., Disp: , Rfl:    cholecalciferol (VITAMIN D3) 25 MCG (1000 UNIT) tablet, Take 1,000 Units by mouth daily., Disp: , Rfl:  CO ENZYME Q-10 PO, Take by mouth., Disp: , Rfl:    b complex vitamins capsule, Take 1 capsule by mouth daily. (Patient not taking: Reported on 08/08/2021), Disp: , Rfl:    betamethasone valerate ointment (VALISONE) 0.1 %, Apply a pea sized amount topically BID for up to 2 weeks as needed. Can use it 1-2 x a week on a regular basis. Not for daily, long term use. (Patient not taking: Reported on 08/08/2021), Disp: 30 g, Rfl: 1   EPINEPHrine 0.3 mg/0.3 mL IJ SOAJ injection, Inject 0.3 mg into the muscle once as needed (anaphylaxis/allergic reaction). (Patient not taking: Reported on 08/08/2021), Disp: 2 each, Rfl: 0   estradiol (ESTRACE) 0.1 MG/GM vaginal cream, 1 gram vaginally qhs x 1 week, then change to twice weekly (Patient not  taking: Reported on 08/08/2021), Disp: 42.5 g, Rfl: 0   milk thistle 175 MG tablet, Take 175 mg by mouth daily. (Patient not taking: Reported on 08/08/2021), Disp: , Rfl:    Multiple Vitamin (MULTIVITAMIN) capsule, Take 1 capsule by mouth daily. (Patient not taking: Reported on 08/08/2021), Disp: , Rfl:    terconazole (TERAZOL 3) 0.8 % vaginal cream, Place 1 applicator vaginally at bedtime. (Patient not taking: Reported on 08/08/2021), Disp: 20 g, Rfl: 0   UNABLE TO FIND, Med Name: Tumeric (Patient not taking: Reported on 08/08/2021), Disp: , Rfl:    Review of Systems:   ROS Negative unless otherwise specified per HPI.  Vitals:   Vitals:   08/08/21 0812  BP: 102/70  Pulse: 83  Temp: (!) 97.2 F (36.2 C)  TempSrc: Temporal  SpO2: 98%  Weight: 140 lb 6.4 oz (63.7 kg)     Body mass index is 21.67 kg/m.  Physical Exam:   Physical Exam Vitals and nursing note reviewed.  Constitutional:      General: She is not in acute distress.    Appearance: She is well-developed. She is not ill-appearing or toxic-appearing.  Cardiovascular:     Rate and Rhythm: Normal rate and regular rhythm.     Pulses: Normal pulses.     Heart sounds: Normal heart sounds, S1 normal and S2 normal.  Pulmonary:     Effort: Pulmonary effort is normal.     Breath sounds: Normal breath sounds.  Abdominal:     General: Abdomen is flat. Bowel sounds are normal.     Palpations: Abdomen is soft.     Tenderness: There is no right CVA tenderness or left CVA tenderness.  Skin:    General: Skin is warm and dry.  Neurological:     Mental Status: She is alert.     GCS: GCS eye subscore is 4. GCS verbal subscore is 5. GCS motor subscore is 6.  Psychiatric:        Speech: Speech normal.        Behavior: Behavior normal. Behavior is cooperative.   Results for orders placed or performed in visit on 08/08/21  POCT Urinalysis Dipstick  Result Value Ref Range   Color, UA yellow    Clarity, UA clear    Glucose, UA  Negative Negative   Bilirubin, UA neg    Ketones, UA neg    Spec Grav, UA <=1.005 (A) 1.010 - 1.025   Blood, UA neg    pH, UA 6.0 5.0 - 8.0   Protein, UA Negative Negative   Urobilinogen, UA 0.2 0.2 or 1.0 E.U./dL   Nitrite, UA neg    Leukocytes, UA Negative Negative   Appearance  Odor      Assessment and Plan:   1. Urinary tract infection symptoms UA is negative Vitals are reassuring, exam is overall normal Will order urine culture for further evaluation I did recommend that she complete treatment for yeast that was prescribed for her OB/GYN  2. Other fatigue Unclear etiology Will obtain basic blood work including CBC, CMP, TSH today Continue to monitor symptoms and follow-up with PCP if symptoms persist Recommend adequate water and regular nutrition  3. Pelvic pain Unclear etiology  symptoms are not severe but she is concerned No evidence of acute abdomen Will order a pelvic ultrasound for further evaluation  4.  Colon cancer screening Patient has not pursued colon cancer screening it is she is looking for a gastroenterologist that we will do this without any sort of sedation She refuses Cologuard at this time  Time spent with patient today was 40 minutes which consisted of chart review, discussing diagnosis, work up, treatment answering questions and documentation.   Inda Coke, PA-C

## 2021-08-09 LAB — URINE CULTURE
MICRO NUMBER:: 12504471
Result:: NO GROWTH
SPECIMEN QUALITY:: ADEQUATE

## 2021-08-11 ENCOUNTER — Other Ambulatory Visit: Payer: Self-pay | Admitting: Physician Assistant

## 2021-08-11 DIAGNOSIS — R17 Unspecified jaundice: Secondary | ICD-10-CM

## 2021-08-11 NOTE — Progress Notes (Signed)
Please inform patient of the following:  Urine culture is negative.  Valerie Gregory. Jerline Pain, MD 08/11/2021 9:37 AM

## 2021-08-11 NOTE — Telephone Encounter (Signed)
Patient was seen at PCP office on 08/08/21

## 2021-08-12 ENCOUNTER — Telehealth: Payer: Self-pay

## 2021-08-12 NOTE — Telephone Encounter (Signed)
Valerie Gregory from [facility] called regarding orders. She needs a clarification on ultrasound orders. Valerie Gregory would like a call back at [phone]. Please Advise.

## 2021-08-12 NOTE — Telephone Encounter (Signed)
Called [facility] and spoke to [facility], told her provider ordered U/S Pelvic Complete not sure why order under imaging says trans abdominal. Elmyra Ricks said it is fine she will change order to complete. She said sometimes it comes up that why but she will change. Told her okay thank you.

## 2021-08-12 NOTE — Telephone Encounter (Signed)
Pt saw Sam on 08/08/21 can you look into this for me please or clarify the order with Sam?

## 2021-08-14 NOTE — Telephone Encounter (Signed)
Routing to Marny Lowenstein, NP.   Encounter closed.

## 2021-08-27 ENCOUNTER — Ambulatory Visit (INDEPENDENT_AMBULATORY_CARE_PROVIDER_SITE_OTHER): Payer: 59 | Admitting: Physician Assistant

## 2021-08-27 ENCOUNTER — Encounter: Payer: Self-pay | Admitting: Physician Assistant

## 2021-08-27 ENCOUNTER — Other Ambulatory Visit: Payer: Self-pay

## 2021-08-27 VITALS — BP 98/65 | HR 83 | Temp 98.1°F | Ht 67.5 in | Wt 139.5 lb

## 2021-08-27 DIAGNOSIS — M79672 Pain in left foot: Secondary | ICD-10-CM

## 2021-08-27 NOTE — Patient Instructions (Signed)
Good to meet you today. Peroneal tendinopathy vs tarsal tunnel syndrome vs arthritis pain Referral to PT with Lauren to help with pain and gait Ice this area well Activity as tolerated

## 2021-08-27 NOTE — Progress Notes (Signed)
Subjective:    Patient ID: Valerie Gregory, female    DOB: 1971/05/17, 50 y.o.   MRN: [id number]  Chief Complaint  Patient presents with   Foot Pain     HPI Patient is in today for left lateral foot pain x several weeks. NKI. She enjoys walking, occasionally running, and has been back and forth between several pairs of shoes. She is looking to get a gait analysis with a PT.  Not icing nor taking anything for pain. No redness, bruising, or swelling.   Past Medical History:  Diagnosis Date   Allergy    Amenorrhea    Anxiety    Fibroid     Past Surgical History:  Procedure Laterality Date   ROOT CANAL     WISDOM TOOTH EXTRACTION      Family History  Problem Relation Age of Onset   Multiple sclerosis Mother    Thyroid disease Father    Hyperthyroidism Father    Colon cancer Maternal Grandmother 88       possible dx after death   Hypertension Maternal Aunt    Heart attack Maternal Aunt 80   Diabetes Paternal Aunt    Heart attack Paternal Aunt 75    Social History   Tobacco Use   Smoking status: Never   Smokeless tobacco: Never  Vaping Use   Vaping Use: Never used  Substance Use Topics   Alcohol use: Yes    Alcohol/week: 1.0 standard drink    Types: 1 Standard drinks or equivalent per week   Drug use: No     Allergies  Allergen Reactions   Shellfish-Derived Products Anaphylaxis, Hives and Swelling   Flublok [Influenza Vaccine Recombinant]    Sulfa Antibiotics Hives    Review of Systems REFER TO HPI FOR PERTINENT POSITIVES AND NEGATIVES      Objective:     BP 98/65   Pulse 83   Temp 98.1 F (36.7 C)   Ht 5' 7.5" (1.715 m)   Wt 139 lb 8 oz (63.3 kg)   LMP 10/26/2018 (Approximate)   SpO2 98%   BMI 21.53 kg/m   Wt Readings from Last 3 Encounters:  08/27/21 139 lb 8 oz (63.3 kg)  08/08/21 140 lb 6.4 oz (63.7 kg)  08/05/21 140 lb (63.5 kg)    BP Readings from Last 3 Encounters:  08/27/21 98/65  08/08/21 102/70  08/05/21 126/74      Physical Exam Cardiovascular:     Pulses:          Dorsalis pedis pulses are 2+ on the right side and 2+ on the left side.       Posterior tibial pulses are 2+ on the right side and 2+ on the left side.  Musculoskeletal:     Right foot: Normal range of motion. No deformity or foot drop.     Left foot: Normal range of motion. No deformity or foot drop.       Feet:  Feet:     Right foot:     Skin integrity: Skin integrity normal. No skin breakdown, erythema, warmth or dry skin.     Left foot:     Skin integrity: Skin integrity normal. No skin breakdown, erythema, warmth or dry skin.     Comments: Colored area is where patient complains of the most pain      Assessment & Plan:   Problem List Items Addressed This Visit   None Visit Diagnoses     Acute foot pain, left    -  Primary   Relevant Orders   Ambulatory referral to Physical Therapy       1. Acute foot pain, left Peroneal tendinopathy vs tarsal tunnel syndrome vs arthritis pain Referral to PT with Lauren to help with pain and gait Ice this area well Activity as tolerated She did not want an XRAY at this time.   Ridley Schewe M Kylin Dubs, PA-C

## 2021-09-01 ENCOUNTER — Telehealth: Payer: Self-pay | Admitting: *Deleted

## 2021-09-01 ENCOUNTER — Other Ambulatory Visit: Payer: Self-pay

## 2021-09-01 ENCOUNTER — Telehealth: Payer: Self-pay

## 2021-09-01 ENCOUNTER — Other Ambulatory Visit: Payer: Self-pay | Admitting: *Deleted

## 2021-09-01 ENCOUNTER — Ambulatory Visit: Payer: 59 | Admitting: Physical Therapy

## 2021-09-01 DIAGNOSIS — M25572 Pain in left ankle and joints of left foot: Secondary | ICD-10-CM | POA: Diagnosis not present

## 2021-09-01 DIAGNOSIS — M79672 Pain in left foot: Secondary | ICD-10-CM

## 2021-09-01 NOTE — Telephone Encounter (Signed)
See previews note 

## 2021-09-01 NOTE — Telephone Encounter (Signed)
Ok with me. Please place any necessary orders. 

## 2021-09-01 NOTE — Telephone Encounter (Signed)
Patient is requesting an order be placed for an xray for her foot, discussed treatment with [name]. Valerie Gregory would like if the information sent through mychart.

## 2021-09-01 NOTE — Telephone Encounter (Signed)
Order placed, unable to contact pt with information  Voice mail is full

## 2021-09-01 NOTE — Telephone Encounter (Signed)
Ok to place an order for Xray of foot Lt Per [facility]?

## 2021-09-04 ENCOUNTER — Other Ambulatory Visit: Payer: Self-pay

## 2021-09-04 ENCOUNTER — Ambulatory Visit (INDEPENDENT_AMBULATORY_CARE_PROVIDER_SITE_OTHER): Payer: 59 | Admitting: Family Medicine

## 2021-09-04 ENCOUNTER — Encounter: Payer: Self-pay | Admitting: Family Medicine

## 2021-09-04 VITALS — BP 93/60 | HR 77 | Temp 98.0°F | Ht 67.5 in | Wt 141.4 lb

## 2021-09-04 DIAGNOSIS — H43399 Other vitreous opacities, unspecified eye: Secondary | ICD-10-CM | POA: Diagnosis not present

## 2021-09-04 DIAGNOSIS — R5383 Other fatigue: Secondary | ICD-10-CM

## 2021-09-04 DIAGNOSIS — Z13220 Encounter for screening for lipoid disorders: Secondary | ICD-10-CM

## 2021-09-04 DIAGNOSIS — T781XXA Other adverse food reactions, not elsewhere classified, initial encounter: Secondary | ICD-10-CM

## 2021-09-04 DIAGNOSIS — M79672 Pain in left foot: Secondary | ICD-10-CM

## 2021-09-04 DIAGNOSIS — Z0001 Encounter for general adult medical examination with abnormal findings: Secondary | ICD-10-CM | POA: Diagnosis not present

## 2021-09-04 DIAGNOSIS — Z1211 Encounter for screening for malignant neoplasm of colon: Secondary | ICD-10-CM

## 2021-09-04 DIAGNOSIS — H6123 Impacted cerumen, bilateral: Secondary | ICD-10-CM | POA: Diagnosis not present

## 2021-09-04 MED ORDER — EPINEPHRINE 0.3 MG/0.3ML IJ SOAJ: 0.3000 mg | Freq: Once | INTRAMUSCULAR | 0 refills | Status: AC | PRN

## 2021-09-04 NOTE — Telephone Encounter (Signed)
Left message at patient home number to call clinic.

## 2021-09-04 NOTE — Assessment & Plan Note (Signed)
Only sleeping about 6 hours per night.  This is interrupted sleep.  Likely main contributor.  We will check labs to rule out any other possible organic causes.

## 2021-09-04 NOTE — Telephone Encounter (Signed)
Patient been seen today

## 2021-09-04 NOTE — Progress Notes (Signed)
Chief Complaint:  Valerie Gregory is a 50 y.o. female who presents today for her annual comprehensive physical exam.    Assessment/Plan:  New/Acute Problems: Foot Pain Xray pending.  She has following up with physical therapy.  If this continues to be an issue will need referral back to sports medicine  Flashing in Vision No red flags.  Normal funduscopic exam today.  Advised her to follow-up with her eye doctor soon  Cerumen Impaction Successfully irrigated by RMA today.   Chronic Problems Addressed Today: Gastrointestinal food sensitivity EpiPen refilled.  Other fatigue Only sleeping about 6 hours per night.  This is interrupted sleep.  Likely main contributor.  We will check labs to rule out any other possible organic causes.   Preventative Healthcare: Check labs.  Will place referral for colonoscopy -she wishes to have this done without anesthesia or sedation. Up-to-date on Pap and mammogram.  Patient Counseling(The following topics were reviewed and/or handout was given):  -Nutrition: Stressed importance of moderation in sodium/caffeine intake, saturated fat and cholesterol, caloric balance, sufficient intake of fresh fruits, vegetables, and fiber.  -Stressed the importance of regular exercise.   -Substance Abuse: Discussed cessation/primary prevention of tobacco, alcohol, or other drug use; driving or other dangerous activities under the influence; availability of treatment for abuse.   -Injury prevention: Discussed safety belts, safety helmets, smoke detector, smoking near bedding or upholstery.   -Sexuality: Discussed sexually transmitted diseases, partner selection, use of condoms, avoidance of unintended pregnancy and contraceptive alternatives.   -Dental health: Discussed importance of regular tooth brushing, flossing, and dental visits.  -Health maintenance and immunizations reviewed. Please refer to Health maintenance section.  Return to care in 1 year for next  preventative visit.     Subjective:  HPI:  She has no acute complaints today.   She has been having issues with left foot pain for several weeks.  Saw the provider a week ago.  Referred to PT.  Symptoms have persisted.  She also has had a couple episodes of flashing lights in her eyes.  This only happens for a few months and then subsides.  Patient with daily symptoms normal.  No pain. She does not remember which eye.   Lifestyle Diet: Balanced. Plenty of fruits and vegetables.  Exercise: Likes walking and riding bike. Pilates.   Depression screen PHQ 2/9 09/04/2021  Decreased Interest 0  Down, Depressed, Hopeless 0  PHQ - 2 Score 0  Altered sleeping -  Tired, decreased energy -  Change in appetite -  Feeling bad or failure about yourself  -  Trouble concentrating -  Moving slowly or fidgety/restless -  Suicidal thoughts -  PHQ-9 Score -  Difficult doing work/chores -    Health Maintenance Due  Topic Date Due   HIV Screening  Never done   Hepatitis C Screening  Never done   COLONOSCOPY (Pts 45-75yrs Insurance coverage will need to be confirmed)  Never done   MAMMOGRAM  Never done   Zoster Vaccines- Shingrix (1 of 2) Never done   COVID-19 Vaccine (3 - Booster for [place] series) 04/10/2020     ROS: Per HPI, otherwise a complete review of systems was negative.   PMH:  The following were reviewed and entered/updated in epic: Past Medical History:  Diagnosis Date   Allergy    Amenorrhea    Anxiety    Fibroid    Patient Active Problem List   Diagnosis Date Noted   Other fatigue 08/08/2020   Gastrointestinal food  sensitivity 08/08/2020   Past Surgical History:  Procedure Laterality Date   ROOT CANAL     WISDOM TOOTH EXTRACTION      Family History  Problem Relation Age of Onset   Multiple sclerosis Mother    Thyroid disease Father    Hyperthyroidism Father    Colon cancer Maternal Grandmother 88       possible dx after death   Hypertension Maternal Aunt     Heart attack Maternal Aunt 80   Diabetes Paternal Aunt    Heart attack Paternal Aunt 28    Medications- reviewed and updated Current Outpatient Medications  Medication Sig Dispense Refill   Ascorbic Acid (QC VITAMIN C WITH ROSE HIPS PO) Take by mouth.     b complex vitamins capsule Take 1 capsule by mouth daily.     betamethasone valerate ointment (VALISONE) 0.1 % Apply a pea sized amount topically BID for up to 2 weeks as needed. Can use it 1-2 x a week on a regular basis. Not for daily, long term use. 30 g 1   cholecalciferol (VITAMIN D3) 25 MCG (1000 UNIT) tablet Take 1,000 Units by mouth daily.     estradiol (ESTRACE) 0.1 MG/GM vaginal cream 1 gram vaginally qhs x 1 week, then change to twice weekly 42.5 g 0   milk thistle 175 MG tablet Take 175 mg by mouth daily.     Multiple Vitamin (MULTIVITAMIN) capsule Take 1 capsule by mouth daily.     terconazole (TERAZOL 3) 0.8 % vaginal cream Place 1 applicator vaginally at bedtime. 20 g 0   UNABLE TO FIND Med Name: Tumeric     EPINEPHrine 0.3 mg/0.3 mL IJ SOAJ injection Inject 0.3 mg into the muscle once as needed (anaphylaxis/allergic reaction). 2 each 0   No current facility-administered medications for this visit.    Allergies-reviewed and updated Allergies  Allergen Reactions   Shellfish-Derived Products Anaphylaxis, Hives and Swelling   Flublok [Influenza Vaccine Recombinant]    Sulfa Antibiotics Hives    Social History   Socioeconomic History   Marital status: Single    Spouse name: Not on file   Number of children: Not on file   Years of education: Not on file   Highest education level: Not on file  Occupational History   Not on file  Tobacco Use   Smoking status: Never   Smokeless tobacco: Never  Vaping Use   Vaping Use: Never used  Substance and Sexual Activity   Alcohol use: Yes    Alcohol/week: 1.0 standard drink    Types: 1 Standard drinks or equivalent per week   Drug use: No   Sexual activity: Yes     Partners: Male    Birth control/protection: None  Other Topics Concern   Not on file  Social History Narrative   Not on file   Social Determinants of Health   Financial Resource Strain: Not on file  Food Insecurity: Not on file  Transportation Needs: Not on file  Physical Activity: Not on file  Stress: Not on file  Social Connections: Not on file        Objective:  Physical Exam: BP 93/60   Pulse 77   Temp 98 F (36.7 C) (Temporal)   Ht 5' 7.5" (1.715 m)   Wt 141 lb 6.4 oz (64.1 kg)   LMP 10/26/2018 (Approximate)   SpO2 96%   BMI 21.82 kg/m   Body mass index is 21.82 kg/m. Wt Readings from Last 3 Encounters:  09/04/21 141 lb 6.4 oz (64.1 kg)  08/27/21 139 lb 8 oz (63.3 kg)  08/08/21 140 lb 6.4 oz (63.7 kg)   Gen: NAD, resting comfortably HEENT: EAC with cerumen bilaterally OP clear. No thyromegaly noted. No apparent abnormalities on funduscopic exam CV: RRR with no murmurs appreciated Pulm: NWOB, CTAB with no crackles, wheezes, or rhonchi GI: Normal bowel sounds present. Soft, Nontender, Nondistended. MSK: no edema, cyanosis, or clubbing noted Skin: warm, dry Neuro: CN2-12 grossly intact. Strength 5/5 in upper and lower extremities. Reflexes symmetric and intact bilaterally.  Psych: Normal affect and thought content     Jasper Ruminski M. Jerline Pain, MD 09/04/2021 9:34 AM

## 2021-09-04 NOTE — Assessment & Plan Note (Signed)
EpiPen refilled 

## 2021-09-04 NOTE — Patient Instructions (Signed)
It was very nice to see you today!  We will flush your ears today  Please get the x-ray of your foot.  Please follow-up with your eye doctor soon.  Please keep up the good work with diet and exercise.  Try to get at least 7 to 8 hours of good quality sleep at night.  I will see you back in year for your next physical.  Come back to see me sooner if needed.  Take care, Dr Jerline Pain  PLEASE NOTE:  If you had any lab tests please let us know if you have not heard back within a few days. You may see your results on mychart before we have a chance to review them but we will give you a call once they are reviewed by [place]. If we ordered any referrals today, please let us know if you have not heard from their office within the next week.   Please try these tips to maintain a healthy lifestyle:  Eat at least 3 REAL meals and 1-2 snacks per day.  Aim for no more than 5 hours between eating.  If you eat breakfast, please do so within one hour of getting up.   Each meal should contain half fruits/vegetables, one quarter protein, and one quarter carbs (no bigger than a computer mouse)  Cut down on sweet beverages. This includes juice, soda, and sweet tea.   Drink at least 1 glass of water with each meal and aim for at least 8 glasses per day  Exercise at least 150 minutes every week.    Preventive Care 52-52 Years Old, Female Preventive care refers to lifestyle choices and visits with your health care provider that can promote health and wellness. Preventive care visits are also called wellness exams. What can I expect for my preventive care visit? Counseling Your health care provider may ask you questions about your: Medical history, including: Past medical problems. Family medical history. Pregnancy history. Current health, including: Menstrual cycle. Method of birth control. Emotional well-being. Home life and relationship well-being. Sexual activity and sexual health. Lifestyle,  including: Alcohol, nicotine or tobacco, and drug use. Access to firearms. Diet, exercise, and sleep habits. Work and work Statistician. Sunscreen use. Safety issues such as seatbelt and bike helmet use. Physical exam Your health care provider will check your: Height and weight. These may be used to calculate your BMI (body mass index). BMI is a measurement that tells if you are at a healthy weight. Waist circumference. This measures the distance around your waistline. This measurement also tells if you are at a healthy weight and may help predict your risk of certain diseases, such as type 2 diabetes and high blood pressure. Heart rate and blood pressure. Body temperature. Skin for abnormal spots. What immunizations do I need? Vaccines are usually given at various ages, according to a schedule. Your health care provider will recommend vaccines for you based on your age, medical history, and lifestyle or other factors, such as travel or where you work. What tests do I need? Screening Your health care provider may recommend screening tests for certain conditions. This may include: Lipid and cholesterol levels. Diabetes screening. This is done by checking your blood sugar (glucose) after you have not eaten for a while (fasting). Pelvic exam and Pap test. Hepatitis B test. Hepatitis C test. HIV (human immunodeficiency virus) test. STI (sexually transmitted infection) testing, if you are at risk. Lung cancer screening. Colorectal cancer screening. Mammogram. Talk with your health care provider  about when you should start having regular mammograms. This may depend on whether you have a family history of breast cancer. BRCA-related cancer screening. This may be done if you have a family history of breast, ovarian, tubal, or peritoneal cancers. Bone density scan. This is done to screen for osteoporosis. Talk with your health care provider about your test results, treatment options, and if  necessary, the need for more tests. Follow these instructions at home: Eating and drinking  Eat a diet that includes fresh fruits and vegetables, whole grains, lean protein, and low-fat dairy products. Take vitamin and mineral supplements as recommended by your health care provider. Do not drink alcohol if: Your health care provider tells you not to drink. You are pregnant, may be pregnant, or are planning to become pregnant. If you drink alcohol: Limit how much you have to 0-1 drink a day. Know how much alcohol is in your drink. In the [place] one drink equals one 12 oz bottle of beer (355 mL), one 5 oz glass of wine (148 mL), or one 1 oz glass of hard liquor (44 mL). Lifestyle Brush your teeth every morning and night with fluoride toothpaste. Floss one time each day. Exercise for at least 30 minutes 5 or more days each week. Do not use any products that contain nicotine or tobacco. These products include cigarettes, chewing tobacco, and vaping devices, such as e-cigarettes. If you need help quitting, ask your health care provider. Do not use drugs. If you are sexually active, practice safe sex. Use a condom or other form of protection to prevent STIs. If you do not wish to become pregnant, use a form of birth control. If you plan to become pregnant, see your health care provider for a prepregnancy visit. Take aspirin only as told by your health care provider. Make sure that you understand how much to take and what form to take. Work with your health care provider to find out whether it is safe and beneficial for you to take aspirin daily. Find healthy ways to manage stress, such as: Meditation, yoga, or listening to music. Journaling. Talking to a trusted person. Spending time with friends and family. Minimize exposure to UV radiation to reduce your risk of skin cancer. Safety Always wear your seat belt while driving or riding in a vehicle. Do not drive: If you have been drinking  alcohol. Do not ride with someone who has been drinking. When you are tired or distracted. While texting. If you have been using any mind-altering substances or drugs. Wear a helmet and other protective equipment during sports activities. If you have firearms in your house, make sure you follow all gun safety procedures. Seek help if you have been physically or sexually abused. What's next? Visit your health care provider once a year for an annual wellness visit. Ask your health care provider how often you should have your eyes and teeth checked. Stay up to date on all vaccines. This information is not intended to replace advice given to you by your health care provider. Make sure you discuss any questions you have with your health care provider. Document Revised: 04/09/2021 Document Reviewed: 04/09/2021 Elsevier Patient Education  [place].

## 2021-09-07 ENCOUNTER — Encounter: Payer: Self-pay | Admitting: Physical Therapy

## 2021-09-07 NOTE — Therapy (Signed)
[facility] [address] [place], [place], [address] Phone: [phone]   Fax:  [phone]  Physical Therapy Evaluation  Patient Details  Name: Valerie Gregory MRN: [id number] Date of Birth: Jul 23, 1971 Referring Provider (PT): Alyssa Allwardt   Encounter Date: 09/01/2021   PT End of Session - 09/07/21 1658     Visit Number 1    Number of Visits 12    Date for PT Re-Evaluation 10/27/21    Authorization Type [employer]    PT Start Time 1220    PT Stop Time 1300    PT Time Calculation (min) 40 min    Activity Tolerance Patient tolerated treatment well    Behavior During Therapy [facility] for tasks assessed/performed             Past Medical History:  Diagnosis Date   Allergy    Amenorrhea    Anxiety    Fibroid     Past Surgical History:  Procedure Laterality Date   ROOT CANAL     WISDOM TOOTH EXTRACTION      There were no vitals filed for this visit.    Subjective Assessment - 09/07/21 1651     Subjective Pt states ongoing L foot pain , at base of 5th, for a few weeks.  She did have previous pain at 5th met, that resolved . Exercise: walks 2 mi/day.  She works full time, can wear sneakers to work, does standing and sitting. She had recent MD visit, x-ray suggested but pt declined at that time.    Limitations Standing;Walking;House hold activities    Patient Stated Goals decreased pain    Currently in Pain? Yes    Pain Score 5     Pain Location Foot    Pain Orientation Left    Pain Descriptors / Indicators Aching;Sore    Pain Type Acute pain    Pain Onset More than a month ago    Pain Frequency Intermittent    Aggravating Factors  standing, walking, activity, certain shoes.    Pain Relieving Factors calf stretch                [facility] PT Assessment - 09/07/21 0001       Assessment   Medical Diagnosis L foot pain    Referring Provider (PT) Alyssa Allwardt    Prior Therapy no      Precautions    Precaution Comments possible fracture? x-ray pending      Balance Screen   Has the patient fallen in the past 6 months No      Prior Function   Level of Independence Independent      Cognition   Overall Cognitive Status Within Functional Limits for tasks assessed      Posture/Postural Control   Posture Comments Standing: mostly neutral foot posture      ROM / Strength   AROM / PROM / Strength AROM;Strength      AROM   Overall AROM Comments Foot/Ankle: WFL, mild limitation for DF bilaterally, HIps: WNL,      Strength   Overall Strength Comments Foot: 4/5, Hips: 4-/5, Knee: 4+/5      Palpation   Palpation comment Tenderness at base of 5th met, mild tenderness in to distal met, other mets non tender.      Special Tests   Other special tests Increased pain with squeeze test on L.      Ambulation/Gait   Gait Comments unremarkable  Objective measurements completed on examination: See above findings.       [facility] Adult PT Treatment/Exercise - 09/07/21 0001       Exercises   Exercises Ankle      Ankle Exercises: Stretches   Gastroc Stretch Limitations reviewed for HEP, standing at wall, and on step (gentle)                     PT Education - 09/07/21 1657     Education Details PT POC, Exam findings, HEP    Person(s) Educated Patient    Methods Explanation;Demonstration    Comprehension Verbalized understanding;Returned demonstration;Need further instruction              PT Short Term Goals - 09/07/21 1706       PT SHORT TERM GOAL #1   Title Pt to be independent with initial HEP    Time 2    Period Weeks    Status New    Target Date 09/15/21               PT Long Term Goals - 09/07/21 1706       PT LONG TERM GOAL #1   Title Pt to be independent wtih final HEP    Time 8    Status New    Target Date 10/27/21      PT LONG TERM GOAL #2   Title Pt to report decreased pain in L foot, to 0-2/10  with standing activity.    Time 6    Period Weeks    Status New    Target Date 10/27/21      PT LONG TERM GOAL #3   Title Pt to report tolerating at least 1 hr of standing/walking without pain in L foot. , to improve ability for community activity    Time 8    Period Weeks    Status New    Target Date 10/27/21      PT LONG TERM GOAL #4   Title Pt to demo strength and stability in L foot to be [facility] for pt age, with SL and dynamic activity.    Time 8    Period Weeks    Status New    Target Date 10/27/21                    Plan - 09/07/21 1713     Clinical Impression Statement Pt presents wtih primary complaint of increased pain in L foot. Pt with mostly neutral foot position and alignment. She has pain at base of 5th met, concerning for stress fracture. She brought different sneakers to appt, discussed wearing whats comfortable for now, ideally with wider toe box for improved comfort. Plan to refer back to MD for obtaining xray before we proceed with other intervention to rule out fracture or stress fracture. Pt in agreement with plan. Pt will likley benefit from future PT sessions after xray, for pain relief, improving DF ROM, strength, gait, and tolerance for weight bearing activity.    Examination-Activity Limitations Stand;Squat;Stairs    Examination-Participation Restrictions Cleaning;Yard Work;Meal Prep;Community Activity;Shop    Stability/Clinical Decision Making Stable/Uncomplicated    Clinical Decision Making Low    Rehab Potential Good    PT Frequency 2x / week    PT Duration 6 weeks    PT Treatment/Interventions ADLs/Self Care Home Management;Cryotherapy;Electrical Stimulation;Ultrasound;Iontophoresis 14m/ml Dexamethasone;Moist Heat;Gait training;Stair training;Functional mobility training;Therapeutic activities;Therapeutic exercise;Balance training;Orthotic Fit/Training;Patient/family education;Neuromuscular re-education;Passive range of motion;Dry  needling;Taping;Vasopneumatic Device;Joint  Manipulations;Spinal Manipulations    Consulted and Agree with Plan of Care Patient             Patient will benefit from skilled therapeutic intervention in order to improve the following deficits and impairments:  Pain, Decreased mobility, Decreased activity tolerance, Decreased strength, Decreased range of motion  Visit Diagnosis: Pain in left ankle and joints of left foot     Problem List Patient Active Problem List   Diagnosis Date Noted   Other fatigue 08/08/2020   Gastrointestinal food sensitivity 08/08/2020   Lyndee Hensen, PT, DPT 6:05 PM  09/07/21    [facility] [place], [place], [address] Phone: [phone]   Fax:  [phone]  Name: Valerie Gregory MRN: [id number] Date of Birth: December 08, 1970

## 2021-09-11 ENCOUNTER — Other Ambulatory Visit: Payer: Self-pay

## 2021-09-11 ENCOUNTER — Encounter: Payer: 59 | Admitting: Physical Therapy

## 2021-09-11 ENCOUNTER — Ambulatory Visit
Admission: RE | Admit: 2021-09-11 | Discharge: 2021-09-11 | Disposition: A | Discharge status: Home / Self Care | Payer: 59 | Source: Ambulatory Visit | Attending: Obstetrics and Gynecology | Admitting: Obstetrics and Gynecology

## 2021-09-11 DIAGNOSIS — Z1231 Encounter for screening mammogram for malignant neoplasm of breast: Secondary | ICD-10-CM

## 2021-09-16 ENCOUNTER — Encounter: Payer: 59 | Admitting: Physical Therapy

## 2021-09-23 ENCOUNTER — Other Ambulatory Visit: Payer: Self-pay

## 2021-09-23 ENCOUNTER — Encounter: Payer: 59 | Admitting: Physical Therapy

## 2021-09-24 ENCOUNTER — Other Ambulatory Visit: Payer: Self-pay | Admitting: Obstetrics and Gynecology

## 2021-09-24 ENCOUNTER — Encounter: Payer: Self-pay | Admitting: Obstetrics and Gynecology

## 2021-09-24 MED ORDER — ESTRADIOL 0.1 MG/GM VA CREA: TOPICAL_CREAM | VAGINAL | 0 refills | Status: DC

## 2021-09-24 NOTE — Telephone Encounter (Signed)
Last annual exam was 02/2021 Last mammogram 7/22

## 2021-09-24 NOTE — Telephone Encounter (Signed)
AEX 03/18/21 Last Mammo 09/11/21 negative

## 2021-09-25 ENCOUNTER — Encounter: Payer: 59 | Admitting: Physical Therapy

## 2021-10-09 ENCOUNTER — Ambulatory Visit (INDEPENDENT_AMBULATORY_CARE_PROVIDER_SITE_OTHER)
Admission: RE | Admit: 2021-10-09 | Discharge: 2021-10-09 | Disposition: A | Discharge status: Home / Self Care | Payer: 59 | Source: Ambulatory Visit | Attending: Family Medicine | Admitting: Family Medicine

## 2021-10-09 ENCOUNTER — Other Ambulatory Visit: Payer: Self-pay

## 2021-10-09 ENCOUNTER — Encounter: Payer: 59 | Admitting: Physical Therapy

## 2021-10-09 DIAGNOSIS — M79672 Pain in left foot: Secondary | ICD-10-CM | POA: Diagnosis not present

## 2021-10-13 NOTE — Progress Notes (Signed)
Please inform patient of the following:  Her foot xray is normal.  Delita Chiquito M. Jerline Pain, MD 10/13/2021 8:23 AM

## 2021-10-15 ENCOUNTER — Encounter: Payer: Self-pay | Admitting: Physical Therapy

## 2021-10-15 ENCOUNTER — Ambulatory Visit (INDEPENDENT_AMBULATORY_CARE_PROVIDER_SITE_OTHER): Payer: 59 | Admitting: Physical Therapy

## 2021-10-15 DIAGNOSIS — R262 Difficulty in walking, not elsewhere classified: Secondary | ICD-10-CM

## 2021-10-15 DIAGNOSIS — M6281 Muscle weakness (generalized): Secondary | ICD-10-CM

## 2021-10-15 NOTE — Therapy (Signed)
[facility] [address] [place], [place], [address] Phone: [phone]   Fax:  [phone]  Physical Therapy Treatment  Patient Details  Name: Valerie Gregory MRN: [id number] Date of Birth: 19-Jun-1971 Referring Provider (PT): Alyssa Allwardt   Encounter Date: 10/15/2021   PT End of Session - 10/15/21 1218     Visit Number 2    Number of Visits 12    Date for PT Re-Evaluation 10/27/21    Authorization Type [employer]    PT Start Time 1219    PT Stop Time 1258    PT Time Calculation (min) 39 min    Activity Tolerance Patient tolerated treatment well    Behavior During Therapy [facility] for tasks assessed/performed             Past Medical History:  Diagnosis Date   Allergy    Amenorrhea    Anxiety    Fibroid     Past Surgical History:  Procedure Laterality Date   ROOT CANAL     WISDOM TOOTH EXTRACTION      There were no vitals filed for this visit.   Subjective Assessment - 10/15/21 1410     Subjective Pt last seen 11/7. She did obtain xray recently, which was negative for stress fx in L foot. She continues to have bothersome pain in 5th met region that has not improved. Increasd pain with standing and walking.    Currently in Pain? Yes    Pain Score 5     Pain Location Foot    Pain Orientation Left    Pain Descriptors / Indicators Aching    Pain Type Acute pain    Pain Onset More than a month ago    Pain Frequency Intermittent    Aggravating Factors  standing, walking                               [facility] Adult PT Treatment/Exercise - 10/15/21 0001       Manual Therapy   Manual Therapy Joint mobilization;Soft tissue mobilization    Joint Mobilization Post ankle mobs to inc DF;    Soft tissue mobilization STM to mets 3-5 and dorsal interossei      Ankle Exercises: Stretches   Soleus Stretch 10 seconds;5 reps   at wall   Gastroc Stretch 3 reps;30 seconds    Gastroc Stretch  Limitations at wall    Other Stretch DF glides, foot on top step x 10 on L      Ankle Exercises: Standing   Heel Raises 20 reps    Other Standing Ankle Exercises Staggered stance weight shifts x 15, with practice for even weight distribution;    Other Standing Ankle Exercises SLS 30 sec x 3 bil ;      Ankle Exercises: Seated   Other Seated Ankle Exercises Toes 2-5, flexion with RTB seated , press to floor      Ankle Exercises: Supine   Other Supine Ankle Exercises Ankle EV x 20 RTB ;                     PT Education - 10/15/21 1412     Education Details Updated and reviewed HEP    Person(s) Educated Patient    Methods Demonstration;Explanation;Tactile cues;Verbal cues;Handout    Comprehension Verbalized understanding;Returned demonstration;Verbal cues required;Tactile cues required;Need further instruction  PT Short Term Goals - 09/07/21 1706       PT SHORT TERM GOAL #1   Title Pt to be independent with initial HEP    Time 2    Period Weeks    Status New    Target Date 09/15/21               PT Long Term Goals - 09/07/21 1706       PT LONG TERM GOAL #1   Title Pt to be independent wtih final HEP    Time 8    Status New    Target Date 10/27/21      PT LONG TERM GOAL #2   Title Pt to report decreased pain in L foot, to 0-2/10 with standing activity.    Time 6    Period Weeks    Status New    Target Date 10/27/21      PT LONG TERM GOAL #3   Title Pt to report tolerating at least 1 hr of standing/walking without pain in L foot. , to improve ability for community activity    Time 8    Period Weeks    Status New    Target Date 10/27/21      PT LONG TERM GOAL #4   Title Pt to demo strength and stability in L foot to be [facility] for pt age, with SL and dynamic activity.    Time 8    Period Weeks    Status New    Target Date 10/27/21                   Plan - 10/15/21 1413     Clinical Impression Statement Pt with  soreness in 5th met with palpation today. She was able to perform ther ex today, with only mild increase in soreness with increased weight bearing and loading of lateral foot with weight shifts and gait. She has limited DF ROM on L vs R, and mild instability in L foot tripod vs R. Reviewed ther ex today for strength and stability, HEP updated. Pt is getting new insurance, and may not be able to attend PT in the future. Discussed need for f/u with MD in future if pain does not improve.    Examination-Activity Limitations Stand;Squat;Stairs    Examination-Participation Restrictions Cleaning;Yard Work;Meal Prep;Community Activity;Shop    Stability/Clinical Decision Making Stable/Uncomplicated    Rehab Potential Good    PT Frequency 2x / week    PT Duration 6 weeks    PT Treatment/Interventions ADLs/Self Care Home Management;Cryotherapy;Electrical Stimulation;Ultrasound;Iontophoresis 35m/ml Dexamethasone;Moist Heat;Gait training;Stair training;Functional mobility training;Therapeutic activities;Therapeutic exercise;Balance training;Orthotic Fit/Training;Patient/family education;Neuromuscular re-education;Passive range of motion;Dry needling;Taping;Vasopneumatic Device;Joint Manipulations;Spinal Manipulations    Consulted and Agree with Plan of Care Patient             Patient will benefit from skilled therapeutic intervention in order to improve the following deficits and impairments:  Pain, Decreased mobility, Decreased activity tolerance, Decreased strength, Decreased range of motion  Visit Diagnosis: Muscle weakness (generalized)  Difficulty in walking, not elsewhere classified     Problem List Patient Active Problem List   Diagnosis Date Noted   Other fatigue 08/08/2020   Gastrointestinal food sensitivity 08/08/2020    LLyndee Hensen PT, DPT 2:16 PM  10/15/21   [place] [address]Phone: [phone]   Fax:  [phone] Name: Valerie DUVAMRN: [id number] of Birth: [birth year]

## 2021-10-15 NOTE — Patient Instructions (Signed)
Access Code: [email or web address] URL: [email or web address] Date: 10/15/2021 Prepared by: Lyndee Hensen  Exercises Supine Shoulder Flexion Extension AAROM with Dowel - 2 x daily - 1 sets - 10 reps Seated Scapular Retraction - 2 x daily - 1 sets - 10 reps Standing Row with Anchored Resistance - 1 x daily - 2 sets - 10 reps Shoulder Flexion Wall Slide with Towel - 2 x daily - 1 sets - 10 reps Supine Chest Stretch with Elbows Bent - 1-2 x daily - 1 sets - 10 reps Sidelying Shoulder External Rotation - 1 x daily - 1-2 sets - 10 reps Standing Shoulder Scaption - 1 x daily - 1 sets - 10 reps Doorway Pec Stretch at 90 Degrees Abduction - 2 x daily - 3 reps - 30 hold Standing Shoulder Internal Rotation Stretch with Towel - 1 x daily - 5-10 reps - 5 hold Standing Shoulder Internal Rotation AAROM with Dowel - 1 x daily - 1 sets - 10 reps

## 2021-10-22 ENCOUNTER — Other Ambulatory Visit: Payer: Self-pay

## 2021-10-22 ENCOUNTER — Ambulatory Visit: Payer: 59 | Admitting: Physical Therapy

## 2021-10-22 ENCOUNTER — Encounter: Payer: Self-pay | Admitting: Physical Therapy

## 2021-10-22 DIAGNOSIS — M79672 Pain in left foot: Secondary | ICD-10-CM

## 2021-10-22 NOTE — Patient Instructions (Signed)
Access Code: [email or web address] URL: [email or web address] Date: 10/22/2021 Prepared by: Lyndee Hensen  Exercises Gastroc Stretch on Wall - 2 x daily - 3 reps - 30 hold Soleus Stretch on Wall - 2 x daily - 3 reps - 30 hold Heel Raises with Counter Support - 1-2 x daily - 1-2 sets - 10 reps Single Leg Stance - 2 x daily - 3 reps - 30 hold Stride Stance Weight Shift - 1 x daily - 1-2 sets - 10 reps Ankle Eversion with Resistance - 1 x daily - 1-2 sets - 10 reps

## 2021-10-22 NOTE — Therapy (Signed)
[facility] [address] [place], [place], [address] Phone: [phone]   Fax:  [phone]  Physical Therapy Treatment  Patient Details  Name: Valerie Gregory MRN: [id number] Date of Birth: [birth year] Referring Provider (PT): Alyssa Allwardt   Encounter Date: 10/22/2021   PT End of Session - 10/22/21 1307     Visit Number 3    Number of Visits 12    Date for PT Re-Evaluation 10/27/21    Authorization Type [employer]    PT Start Time 1220    PT Stop Time 1303    PT Time Calculation (min) 43 min    Activity Tolerance Patient tolerated treatment well    Behavior During Therapy [facility] for tasks assessed/performed             Past Medical History:  Diagnosis Date   Allergy    Amenorrhea    Anxiety    Fibroid     Past Surgical History:  Procedure Laterality Date   ROOT CANAL     WISDOM TOOTH EXTRACTION      There were no vitals filed for this visit.   Subjective Assessment - 10/22/21 1306     Subjective Pt states decreased pain this week, has been doing HEP    Patient Stated Goals decreased pain    Currently in Pain? Yes    Pain Score 4     Pain Location Foot    Pain Orientation Left    Pain Descriptors / Indicators Aching    Pain Type Acute pain    Pain Onset More than a month ago    Pain Frequency Intermittent                               [facility] Adult PT Treatment/Exercise - 10/22/21 0001       Manual Therapy   Manual Therapy Joint mobilization;Soft tissue mobilization    Joint Mobilization --    Soft tissue mobilization --      Ankle Exercises: Stretches   Soleus Stretch 10 seconds;5 reps   at wall   Gastroc Stretch 3 reps;30 seconds    Gastroc Stretch Limitations at wall    Other Stretch DF glides, foot on top step x 10 on L      Ankle Exercises: Standing   Heel Raises 20 reps   with ball squeeze   Other Standing Ankle Exercises Staggered stance weight shifts x 20, with  practice for even weight distribution;    Other Standing Ankle Exercises SLS 30 sec x 2  bil ; SLS with UE rotation x 10 bil;      Ankle Exercises: Seated   Other Seated Ankle Exercises Toes 2-5, flexion with RTB seated , press to floor;   Gr toe flexion x 10 x 20 RTB ;      Ankle Exercises: Supine   Other Supine Ankle Exercises Ankle EV x 20 RTB ;                     PT Education - 10/22/21 1307     Education Details Reviewed HEP, reviewed optimal footwear/fit    Person(s) Educated Patient    Methods Explanation;Demonstration;Verbal cues;Handout;Tactile cues    Comprehension Verbalized understanding;Returned demonstration;Verbal cues required;Tactile cues required;Need further instruction              PT Short Term Goals - 09/07/21 1706  PT SHORT TERM GOAL #1   Title Pt to be independent with initial HEP    Time 2    Period Weeks    Status New    Target Date 09/15/21               PT Long Term Goals - 09/07/21 1706       PT LONG TERM GOAL #1   Title Pt to be independent wtih final HEP    Time 8    Status New    Target Date 10/27/21      PT LONG TERM GOAL #2   Title Pt to report decreased pain in L foot, to 0-2/10 with standing activity.    Time 6    Period Weeks    Status New    Target Date 10/27/21      PT LONG TERM GOAL #3   Title Pt to report tolerating at least 1 hr of standing/walking without pain in L foot. , to improve ability for community activity    Time 8    Period Weeks    Status New    Target Date 10/27/21      PT LONG TERM GOAL #4   Title Pt to demo strength and stability in L foot to be [facility] for pt age, with SL and dynamic activity.    Time 8    Period Weeks    Status New    Target Date 10/27/21                   Plan - 10/22/21 1309     Clinical Impression Statement Pt with good ability for ther ex today. Able to perform heel raises and balance without increased pain in foot. Progressed strengthening  and stability for foot and ankle, pt to benefit from continued care.    Examination-Activity Limitations Stand;Squat;Stairs    Examination-Participation Restrictions Cleaning;Yard Work;Meal Prep;Community Activity;Shop    Stability/Clinical Decision Making Stable/Uncomplicated    Rehab Potential Good    PT Frequency 2x / week    PT Duration 6 weeks    PT Treatment/Interventions ADLs/Self Care Home Management;Cryotherapy;Electrical Stimulation;Ultrasound;Iontophoresis 4mg /ml Dexamethasone;Moist Heat;Gait training;Stair training;Functional mobility training;Therapeutic activities;Therapeutic exercise;Balance training;Orthotic Fit/Training;Patient/family education;Neuromuscular re-education;Passive range of motion;Dry needling;Taping;Vasopneumatic Device;Joint Manipulations;Spinal Manipulations    Consulted and Agree with Plan of Care Patient             Patient will benefit from skilled therapeutic intervention in order to improve the following deficits and impairments:  Pain, Decreased mobility, Decreased activity tolerance, Decreased strength, Decreased range of motion  Visit Diagnosis: Left foot pain     Problem List Patient Active Problem List   Diagnosis Date Noted   Other fatigue 08/08/2020   Gastrointestinal food sensitivity 08/08/2020   Lyndee Hensen, PT, DPT 1:35 PM  10/22/21    [place], [place], [address] Phone: [phone]   Fax:  [phone]  Name: Valerie Gregory MRN: [id number] Date of Birth: Sep 01, 1971

## 2021-10-24 ENCOUNTER — Encounter: Payer: Self-pay | Admitting: Physical Therapy

## 2021-10-24 ENCOUNTER — Ambulatory Visit: Payer: 59 | Admitting: Physical Therapy

## 2021-10-24 ENCOUNTER — Other Ambulatory Visit: Payer: Self-pay

## 2021-10-24 DIAGNOSIS — R262 Difficulty in walking, not elsewhere classified: Secondary | ICD-10-CM

## 2021-10-24 DIAGNOSIS — M6281 Muscle weakness (generalized): Secondary | ICD-10-CM

## 2021-10-24 DIAGNOSIS — M79672 Pain in left foot: Secondary | ICD-10-CM | POA: Diagnosis not present

## 2021-10-24 NOTE — Therapy (Addendum)
[facility] [address] [place], [place], [address] Phone: [phone]   Fax:  [phone]  Physical Therapy Treatment  Patient Details  Name: Valerie Gregory MRN: [id number] Date of Birth: 1970-10-29 Referring Provider (PT): Alyssa Allwardt   Encounter Date: 10/24/2021   PT End of Session - 10/24/21 1049     Visit Number 4    Number of Visits 12    Date for PT Re-Evaluation 10/27/21    Authorization Type [employer]    PT Start Time 1020    PT Stop Time 1058    PT Time Calculation (min) 38 min    Activity Tolerance Patient tolerated treatment well    Behavior During Therapy [facility] for tasks assessed/performed             Past Medical History:  Diagnosis Date   Allergy    Amenorrhea    Anxiety    Fibroid     Past Surgical History:  Procedure Laterality Date   ROOT CANAL     WISDOM TOOTH EXTRACTION      There were no vitals filed for this visit.   Subjective Assessment - 10/24/21 1103     Subjective Pt states decreasing pain in L foot. Has been doing well with HEP.    Patient Stated Goals decreased pain    Currently in Pain? Yes    Pain Score 3     Pain Location Foot    Pain Orientation Left    Pain Descriptors / Indicators Aching    Pain Type Acute pain    Pain Onset More than a month ago    Pain Frequency Intermittent                               [facility] Adult PT Treatment/Exercise - 10/24/21 0001       Manual Therapy   Manual Therapy Joint mobilization;Soft tissue mobilization    Joint Mobilization Post ankle mobs to inc DF; met  3-5 mobs      Ankle Exercises: Stretches   Soleus Stretch 10 seconds;5 reps   at wall   Gastroc Stretch --    Gastroc Stretch Limitations --    Other Stretch DF glides, foot on chair  x 15 on L with BL TB - education for HEP      Ankle Exercises: Standing   Heel Raises 20 reps   with ball squeeze   Other Standing Ankle Exercises walk/march 10 ft  x 4;  mini squats at table x 15    Other Standing Ankle Exercises SLS 30 sec x 2  bil ; SLS with head turns x 15 bil;      Ankle Exercises: Seated   Other Seated Ankle Exercises Toes 2-5, flexion with RTB seated , press to floor;   Gr toe flexion x 10 x 20 RTB ;      Ankle Exercises: Supine   Other Supine Ankle Exercises Ankle EV x 20 RTB ;                     PT Education - 10/24/21 1104     Education Details reviewed final HEP in detail. discussed referral to sports med if pain does not improve in future.    Person(s) Educated Patient    Methods Explanation;Demonstration;Tactile cues;Verbal cues;Handout    Comprehension Verbalized understanding;Returned demonstration;Verbal cues required;Tactile cues required  PT Short Term Goals - 10/24/21 1104       PT SHORT TERM GOAL #1   Title Pt to be independent with initial HEP    Time 2    Period Weeks    Status Achieved    Target Date 09/15/21               PT Long Term Goals - 10/24/21 1104       PT LONG TERM GOAL #1   Title Pt to be independent wtih final HEP    Time 8    Status Achieved    Target Date 10/27/21      PT LONG TERM GOAL #2   Title Pt to report decreased pain in L foot, to 0-2/10 with standing activity.    Time 6    Period Weeks    Status Partially Met    Target Date 10/27/21      PT LONG TERM GOAL #3   Title Pt to report tolerating at least 1 hr of standing/walking without pain in L foot. , to improve ability for community activity    Time 8    Period Weeks    Status Achieved    Target Date 10/27/21      PT LONG TERM GOAL #4   Title Pt to demo strength and stability in L foot to be [facility] for pt age, with SL and dynamic activity.    Time 8    Period Weeks    Status Partially Met    Target Date 10/27/21                   Plan - 10/24/21 1116     Clinical Impression Statement Pt with improving pain. She will likely not be returning to PT because her  insurance is changing next week. Final HEP reviewed in detail today. She has mild tenderness in mets 4-5 with palpation today. She is doing well with strengthening exercises for improved weight bearing more evenly through whole forefoot vs more laterally. Discussed referral to sports med or ortho if pain in foot does not continue to improve int he future. We also discussed optimal footwear, pt in need of updated shoes, with wide toe box and good arch support. Pt will be put on hold at this time, will d/c if she does not return due to insurance. Pt in agreement with plan.    Examination-Activity Limitations Stand;Squat;Stairs    Examination-Participation Restrictions Cleaning;Yard Work;Meal Prep;Community Activity;Shop    Stability/Clinical Decision Making Stable/Uncomplicated    Rehab Potential Good    PT Frequency 2x / week    PT Duration 6 weeks    PT Treatment/Interventions ADLs/Self Care Home Management;Cryotherapy;Electrical Stimulation;Ultrasound;Iontophoresis 4mg /ml Dexamethasone;Moist Heat;Gait training;Stair training;Functional mobility training;Therapeutic activities;Therapeutic exercise;Balance training;Orthotic Fit/Training;Patient/family education;Neuromuscular re-education;Passive range of motion;Dry needling;Taping;Vasopneumatic Device;Joint Manipulations;Spinal Manipulations    Consulted and Agree with Plan of Care Patient             Patient will benefit from skilled therapeutic intervention in order to improve the following deficits and impairments:  Pain, Decreased mobility, Decreased activity tolerance, Decreased strength, Decreased range of motion  Visit Diagnosis: Left foot pain  Muscle weakness (generalized)  Difficulty in walking, not elsewhere classified     Problem List Patient Active Problem List   Diagnosis Date Noted   Other fatigue 08/08/2020   Gastrointestinal food sensitivity 08/08/2020   Sedalia Muta, PT, DPT 11:47 AM  10/24/21    [facility]  [address] [place], [place], [address] Phone: [phone]   Fax:  [phone]  Name: KATALYN CONSIGLIO MRN: [id number] Date of Birth: 1971-07-26  PHYSICAL THERAPY DISCHARGE SUMMARY  Visits from Start of Care: 4 Plan: Patient agrees to discharge.  Patient goals were partially  met. Patient is being discharged due to- pt did not return after getting new Insurance.       Sedalia Muta, PT, DPT 12:12 PM  01/26/22

## 2022-07-20 ENCOUNTER — Encounter: Payer: Self-pay | Admitting: *Deleted

## 2022-10-08 ENCOUNTER — Ambulatory Visit: Payer: Self-pay | Admitting: Family Medicine

## 2022-10-08 ENCOUNTER — Encounter: Payer: Self-pay | Admitting: *Deleted

## 2022-10-14 ENCOUNTER — Other Ambulatory Visit (HOSPITAL_COMMUNITY)
Admission: RE | Admit: 2022-10-14 | Discharge: 2022-10-14 | Disposition: A | Discharge status: Home / Self Care | Payer: Commercial Managed Care - HMO | Source: Ambulatory Visit | Attending: Physician Assistant | Admitting: Physician Assistant

## 2022-10-14 ENCOUNTER — Ambulatory Visit (INDEPENDENT_AMBULATORY_CARE_PROVIDER_SITE_OTHER): Payer: Commercial Managed Care - HMO | Admitting: Physician Assistant

## 2022-10-14 ENCOUNTER — Encounter: Payer: Self-pay | Admitting: Physician Assistant

## 2022-10-14 VITALS — BP 108/70 | HR 75 | Temp 97.7°F | Ht 67.5 in | Wt 140.2 lb

## 2022-10-14 DIAGNOSIS — F4321 Adjustment disorder with depressed mood: Secondary | ICD-10-CM | POA: Diagnosis not present

## 2022-10-14 DIAGNOSIS — N898 Other specified noninflammatory disorders of vagina: Secondary | ICD-10-CM | POA: Insufficient documentation

## 2022-10-14 DIAGNOSIS — R300 Dysuria: Secondary | ICD-10-CM | POA: Diagnosis not present

## 2022-10-14 DIAGNOSIS — R229 Localized swelling, mass and lump, unspecified: Secondary | ICD-10-CM

## 2022-10-14 LAB — POCT URINALYSIS DIPSTICK
Bilirubin, UA: NEGATIVE
Blood, UA: NEGATIVE
Glucose, UA: NEGATIVE
Ketones, UA: NEGATIVE
Leukocytes, UA: NEGATIVE
Nitrite, UA: NEGATIVE
Protein, UA: NEGATIVE
Spec Grav, UA: <=1.005 — AB (ref 1.010–1.025)
Urobilinogen, UA: 0.2 E.U./dL
pH, UA: 7 (ref 5.0–8.0)

## 2022-10-14 MED ORDER — AMOXICILLIN-POT CLAVULANATE 875-125 MG PO TABS: 1.0000 | ORAL_TABLET | Freq: Two times a day (BID) | ORAL | 0 refills | Status: DC

## 2022-10-14 NOTE — Patient Instructions (Signed)
It was great to see you!  I will be in touch with your vaginal swab results  Start oral antibiotic Plan to follow-up with Dr Talbert Nan regarding your pelvic discomfort and skin bump  General instructions Make sure you: Pee until your bladder is empty. Do not hold pee for a long time. Empty your bladder after sex. Wipe from front to back after pooping if you are a female. Use each tissue one time when you wipe. Drink enough fluid to keep your pee pale yellow. Keep all follow-up visits as told by your doctor. This is important. Contact a doctor if: You do not get better after 1-2 days. Your symptoms go away and then come back. Get help right away if: You have very bad back pain. You have very bad pain in your lower belly. You have a fever. You are sick to your stomach (nauseous). You are throwing up.   Take care,  Inda Coke PA-C

## 2022-10-14 NOTE — Progress Notes (Signed)
Valerie Gregory is a 51 y.o. female here for a new problem.  History of Present Illness:   Chief Complaint  Patient presents with   Urinary Frequency    Pt c/o urinary frequency, slight burning, lower abdominal discomfort.Denies fever or chills.    HPI  Urinary frequency and pelvic discomfort Started a few weeks ago Feels dull discomfort/pressure in lower abdomen Intermittent, not-severe Has had in the past and usually self treats with sugar-free cranberry juice and this relieves symptoms Also having urinary frequency/urgency Had some improvement but then returned Is sexually active, unsure if she could have STI Denies: vaginal discharge, n/v/d, fever, chills, back pain  Skin nodule She has noticed a small nodule near her pubic bone on her hair line a few weeks ago Has improved Has tried topical oregano oil No discharge or severe pain associated with it  Grieving Her mom passed away unexpectedly last month Died from complications of urosepsis and had difficulty coming off of ventilator She has a therapist and is looking into more counseling options in the new year with new insurance Denies SI/HI   Past Medical History:  Diagnosis Date   Allergy    Amenorrhea    Anxiety    Fibroid      Social History   Tobacco Use   Smoking status: Never   Smokeless tobacco: Never  Vaping Use   Vaping Use: Never used  Substance Use Topics   Alcohol use: Yes    Alcohol/week: 1.0 standard drink of alcohol    Types: 1 Standard drinks or equivalent per week   Drug use: No    Past Surgical History:  Procedure Laterality Date   ROOT CANAL     WISDOM TOOTH EXTRACTION      Family History  Problem Relation Age of Onset   Multiple sclerosis Mother    Thyroid disease Father    Hyperthyroidism Father    Colon cancer Maternal Grandmother 78       possible dx after death   Hypertension Maternal Aunt    Heart attack Maternal Aunt 80   Diabetes Paternal Aunt    Heart attack  Paternal Aunt 12    Allergies  Allergen Reactions   Shellfish-Derived Products Anaphylaxis, Hives and Swelling   Flublok [Influenza Vaccine Recombinant]    Sulfa Antibiotics Hives    Current Medications:   Current Outpatient Medications:    amoxicillin-clavulanate (AUGMENTIN) 875-125 MG tablet, Take 1 tablet by mouth 2 (two) times daily., Disp: 20 tablet, Rfl: 0   Ascorbic Acid (QC VITAMIN C WITH ROSE HIPS PO), Take by mouth., Disp: , Rfl:    b complex vitamins capsule, Take 1 capsule by mouth daily., Disp: , Rfl:    cholecalciferol (VITAMIN D3) 25 MCG (1000 UNIT) tablet, Take 1,000 Units by mouth daily., Disp: , Rfl:    EPINEPHrine 0.3 mg/0.3 mL IJ SOAJ injection, Inject 0.3 mg into the muscle once as needed (anaphylaxis/allergic reaction)., Disp: 2 each, Rfl: 0   estradiol (ESTRACE) 0.1 MG/GM vaginal cream, 1 gram vaginally twice weekly at hs, Disp: 42.5 g, Rfl: 0   Magnesium 400 MG TABS, Take 1-2 tablets by mouth daily in the afternoon., Disp: , Rfl:    Multiple Vitamin (MULTIVITAMIN) capsule, Take 1 capsule by mouth daily., Disp: , Rfl:    betamethasone valerate ointment (VALISONE) 0.1 %, Apply a pea sized amount topically BID for up to 2 weeks as needed. Can use it 1-2 x a week on a regular basis. Not for daily,  long term use. (Patient not taking: Reported on 10/14/2022), Disp: 30 g, Rfl: 1   Review of Systems:   ROS Negative unless otherwise specified per HPI.  Vitals:   Vitals:   10/14/22 1136  BP: 108/70  Pulse: 75  Temp: 97.7 F (36.5 C)  TempSrc: Temporal  SpO2: 98%  Weight: 140 lb 4 oz (63.6 kg)  Height: 5' 7.5" (1.715 m)     Body mass index is 21.64 kg/m.  Physical Exam:   Physical Exam Vitals and nursing note reviewed. Exam conducted with a chaperone present Butch Penny LPN).  Constitutional:      General: She is not in acute distress.    Appearance: She is well-developed. She is not ill-appearing or toxic-appearing.  Cardiovascular:     Rate and Rhythm:  Normal rate and regular rhythm.     Pulses: Normal pulses.     Heart sounds: Normal heart sounds, S1 normal and S2 normal.  Pulmonary:     Effort: Pulmonary effort is normal.     Breath sounds: Normal breath sounds.  Abdominal:     General: Abdomen is flat. Bowel sounds are normal.     Palpations: Abdomen is soft.     Tenderness: There is no abdominal tenderness. There is no right CVA tenderness or left CVA tenderness.  Genitourinary:    Vagina: Normal.     Cervix: No cervical motion tenderness.     Comments: Erythematous patches on b/l superior vulva area - chronic per patient and consistent with past documentation by ob  Small cyst-like bump in central mons pubic area without TTP, fluctuance or induration Skin:    General: Skin is warm and dry.  Neurological:     Mental Status: She is alert.     GCS: GCS eye subscore is 4. GCS verbal subscore is 5. GCS motor subscore is 6.  Psychiatric:        Speech: Speech normal.        Behavior: Behavior normal. Behavior is cooperative.    Results for orders placed or performed in visit on 10/14/22  POCT urinalysis dipstick  Result Value Ref Range   Color, UA no color    Clarity, UA clear    Glucose, UA Negative Negative   Bilirubin, UA Negative    Ketones, UA Negative    Spec Grav, UA <=1.005 (A) 1.010 - 1.025   Blood, UA Negative    pH, UA 7.0 5.0 - 8.0   Protein, UA Negative Negative   Urobilinogen, UA 0.2 0.2 or 1.0 E.U./dL   Nitrite, UA Negative    Leukocytes, UA Negative Negative   Appearance     Odor        Assessment and Plan:   Dysuria No red flags Start oral augmentin for for possible early UTI - she does not want to use keflex or cipro as she has had a bad experience in the past with these Will await culture results and change/stop tx if indicated STD testing obtained on vaginal swab If new/worsening sx, I asked for her to reach out to [place] For her pelvic discomfort, I recommended close follow-up with ob - she was  agreeable  Skin nodule Possible folliculitis? No evidence of area amenable to I&D STD testing obtained on vaginal swab Will have her follow-up with gyn if all testing is negative  Grieving Appropriate grieving No red flags Denies SI/HI Continue to provide emotional support Recommend close follow-up with PCP or gyn  I,Alexis Herring,acting as a scribe for  Inda Coke, PA.,have documented all relevant documentation on the behalf of Inda Coke, PA,as directed by  Inda Coke, PA while in the presence of Inda Coke, [place].  I, Inda Coke, [place], have reviewed all documentation for this visit. The documentation on 10/14/22 for the exam, diagnosis, procedures, and orders are all accurate and complete.   Inda Coke, PA-C

## 2022-10-15 LAB — CERVICOVAGINAL ANCILLARY ONLY
Bacterial Vaginitis (gardnerella): NEGATIVE
Candida Glabrata: NEGATIVE
Candida Vaginitis: NEGATIVE
Chlamydia: NEGATIVE
Comment: NEGATIVE
Comment: NEGATIVE
Comment: NEGATIVE
Comment: NEGATIVE
Comment: NEGATIVE
Comment: NORMAL
Neisseria Gonorrhea: NEGATIVE
Trichomonas: NEGATIVE

## 2023-03-23 NOTE — Progress Notes (Signed)
Valerie Gregory is a 52 y.o. female here for a new problem.  History of Present Illness:   No chief complaint on file.   HPI  Fatigue Has been feeling increased fatigue since   Past Medical History:  Diagnosis Date   Allergy    Amenorrhea    Anxiety    Fibroid      Social History   Tobacco Use   Smoking status: Never   Smokeless tobacco: Never  Vaping Use   Vaping Use: Never used  Substance Use Topics   Alcohol use: Yes    Alcohol/week: 1.0 standard drink of alcohol    Types: 1 Standard drinks or equivalent per week   Drug use: No    Past Surgical History:  Procedure Laterality Date   ROOT CANAL     WISDOM TOOTH EXTRACTION      Family History  Problem Relation Age of Onset   Multiple sclerosis Mother    Heart failure Mother    Thyroid disease Father    Hyperthyroidism Father    Colon cancer Maternal Grandmother 62       possible dx after death   Hypertension Maternal Aunt    Heart attack Maternal Aunt 99   Diabetes Paternal Aunt    Heart attack Paternal Aunt 21    Allergies  Allergen Reactions   Shellfish-Derived Products Anaphylaxis, Hives and Swelling   Flublok [Influenza Vaccine Recombinant]    Sulfa Antibiotics Hives    Current Medications:   Current Outpatient Medications:    amoxicillin-clavulanate (AUGMENTIN) 875-125 MG tablet, Take 1 tablet by mouth 2 (two) times daily., Disp: 20 tablet, Rfl: 0   Ascorbic Acid (QC VITAMIN C WITH ROSE HIPS PO), Take by mouth., Disp: , Rfl:    b complex vitamins capsule, Take 1 capsule by mouth daily., Disp: , Rfl:    betamethasone valerate ointment (VALISONE) 0.1 %, Apply a pea sized amount topically BID for up to 2 weeks as needed. Can use it 1-2 x a week on a regular basis. Not for daily, long term use. (Patient not taking: Reported on 10/14/2022), Disp: 30 g, Rfl: 1   cholecalciferol (VITAMIN D3) 25 MCG (1000 UNIT) tablet, Take 1,000 Units by mouth daily., Disp: , Rfl:    EPINEPHrine 0.3 mg/0.3 mL IJ  SOAJ injection, Inject 0.3 mg into the muscle once as needed (anaphylaxis/allergic reaction)., Disp: 2 each, Rfl: 0   estradiol (ESTRACE) 0.1 MG/GM vaginal cream, 1 gram vaginally twice weekly at hs, Disp: 42.5 g, Rfl: 0   Magnesium 400 MG TABS, Take 1-2 tablets by mouth daily in the afternoon., Disp: , Rfl:    Multiple Vitamin (MULTIVITAMIN) capsule, Take 1 capsule by mouth daily., Disp: , Rfl:    Review of Systems:   ROS  Vitals:   There were no vitals filed for this visit.   There is no height or weight on file to calculate BMI.  Physical Exam:   Physical Exam  Assessment and Plan:   ***   I,Alexander Ruley,acting as a scribe for Jarold Motto, PA.,have documented all relevant documentation on the behalf of Jarold Motto, PA,as directed by  Jarold Motto, PA while in the presence of Jarold Motto, [name].   ***   Jarold Motto, PA-C

## 2023-03-24 ENCOUNTER — Encounter: Payer: Self-pay | Admitting: Physician Assistant

## 2023-03-24 ENCOUNTER — Ambulatory Visit (INDEPENDENT_AMBULATORY_CARE_PROVIDER_SITE_OTHER): Payer: 59 | Admitting: Physician Assistant

## 2023-03-24 VITALS — BP 120/76 | HR 72 | Temp 97.5°F | Ht 67.5 in | Wt 142.0 lb

## 2023-03-24 DIAGNOSIS — F4321 Adjustment disorder with depressed mood: Secondary | ICD-10-CM | POA: Diagnosis not present

## 2023-03-24 DIAGNOSIS — H6123 Impacted cerumen, bilateral: Secondary | ICD-10-CM

## 2023-03-24 DIAGNOSIS — R69 Illness, unspecified: Secondary | ICD-10-CM | POA: Diagnosis not present

## 2023-03-24 MED ORDER — DOXYCYCLINE HYCLATE 100 MG PO TABS: 100.0000 mg | ORAL_TABLET | Freq: Two times a day (BID) | ORAL | 0 refills | Status: DC

## 2023-03-24 NOTE — Patient Instructions (Signed)
It was great to see you!  Start doxycycline for your symptoms Keep [place] posted!   Take care,  Jarold Motto PA-C

## 2023-09-14 ENCOUNTER — Encounter: Payer: Self-pay | Admitting: Physician Assistant

## 2023-09-14 ENCOUNTER — Ambulatory Visit: Payer: 59 | Admitting: Physician Assistant

## 2023-09-14 VITALS — BP 120/80 | HR 77 | Temp 97.8°F | Ht 67.5 in | Wt 147.2 lb

## 2023-09-14 DIAGNOSIS — R5383 Other fatigue: Secondary | ICD-10-CM

## 2023-09-14 DIAGNOSIS — N952 Postmenopausal atrophic vaginitis: Secondary | ICD-10-CM | POA: Diagnosis not present

## 2023-09-14 DIAGNOSIS — Z1231 Encounter for screening mammogram for malignant neoplasm of breast: Secondary | ICD-10-CM

## 2023-09-14 DIAGNOSIS — Z1211 Encounter for screening for malignant neoplasm of colon: Secondary | ICD-10-CM

## 2023-09-14 MED ORDER — ESTRADIOL 0.1 MG/GM VA CREA: TOPICAL_CREAM | VAGINAL | 0 refills | Status: DC

## 2023-09-14 NOTE — Patient Instructions (Signed)
It was great to see you!  Call [facility] to inquire about mammogram  Colonoscopy referral put in for [facility] gastroenterology  [phone]  We will be in touch with results  Let's follow-up soon regarding fatigue with Dr Jimmey Ralph, sooner if you have concerns.  Take care,  Jarold Motto PA-C

## 2023-09-14 NOTE — Progress Notes (Signed)
Valerie Gregory is a 52 y.o. female here for a follow-up of a pre-existing problem.  History of Present Illness:   Health Maintenance Due  Topic Date Due   HIV Screening  Never done   Hepatitis C Screening  Never done   Colonoscopy  Never done   Zoster Vaccines- Shingrix (1 of 2) Never done   COVID-19 Vaccine (3 - 2023-24 season) 06/27/2023   MAMMOGRAM  09/12/2023   Chief Complaint  Patient presents with   Fatigue    Pt c/o feeling tired at least a month, grieving the loss of her mother.   HPI  Fatigue: Has noticed an increase in fatigue recently Wasn't getting enough sleep recently -- having trouble staying asleep but the last two nights she has had better sleep  Has started supplements two weeks ago -- added tumeric, oil of oregano, vitamin D, magnesium, and healthier eating since she made her appointment with [place] today and states that her fatigue has improved just slightly  She is exercising -- walking at least daily, trying to add in riding an exercise bike regularly Denies unintentional weight loss  Drinking caffeine regularly - has multiple cups of caffeinated tea daily No excessive alcohol, no more than 1 drink weekly  Participating in grief counseling since her mother passed away Denies any significant current mood disorder, anxiety or depression however she also reports that she doesn't really want to open up about this today  Has concerns with attention deficit disorder and does not want evaluation or treatment  She has not had a colonoscopy. She is requesting to have this done without sedation and has not called [place] [facility] to schedule this -- she is agreeable to re-submit referral. She does not want to do a Cologuard test because she does not want to have to potentially pay for a diagnostic colonoscopy if this is abnormal.  She has not had an updated mammogram. She was not sure if her current insurance would cover for this.  Vaginal atrophy She  needs a refill on her vaginal estradiol cream She is denies any concerns  She is overdue for follow-up with gynecology   Past Medical History:  Diagnosis Date   Allergy    Amenorrhea    Anxiety    Fibroid     Social History   Tobacco Use   Smoking status: Never   Smokeless tobacco: Never  Vaping Use   Vaping status: Never Used  Substance Use Topics   Alcohol use: Yes    Alcohol/week: 1.0 standard drink of alcohol    Types: 1 Standard drinks or equivalent per week   Drug use: No   Past Surgical History:  Procedure Laterality Date   ROOT CANAL     WISDOM TOOTH EXTRACTION     Family History  Problem Relation Age of Onset   Multiple sclerosis Mother    Heart failure Mother    Thyroid disease Father    Hyperthyroidism Father    Colon cancer Maternal Grandmother 49       possible dx after death   Hypertension Maternal Aunt    Heart attack Maternal Aunt 30   Diabetes Paternal Aunt    Heart attack Paternal Aunt 69   Allergies  Allergen Reactions   Shellfish-Derived Products Anaphylaxis, Hives and Swelling   Flublok [Influenza Vaccine Recombinant]    Sulfa Antibiotics Hives   Strawberry Extract Rash   Current Medications:   Current Outpatient Medications:    Ascorbic Acid (QC VITAMIN C WITH  ROSE HIPS PO), Take by mouth., Disp: , Rfl:    b complex vitamins capsule, Take 1 capsule by mouth daily., Disp: , Rfl:    cholecalciferol (VITAMIN D3) 25 MCG (1000 UNIT) tablet, Take 1,000 Units by mouth daily., Disp: , Rfl:    EPINEPHrine 0.3 mg/0.3 mL IJ SOAJ injection, Inject 0.3 mg into the muscle once as needed (anaphylaxis/allergic reaction)., Disp: 2 each, Rfl: 0   estradiol (ESTRACE) 0.1 MG/GM vaginal cream, 1 gram vaginally twice weekly at hs, Disp: 42.5 g, Rfl: 0   Magnesium 400 MG TABS, Take 1-2 tablets by mouth daily in the afternoon., Disp: , Rfl:    OIL OF [place] PO, Take 1 tablet by mouth daily in the afternoon., Disp: , Rfl:    TURMERIC PO, Take 1 capsule by  mouth daily in the afternoon., Disp: , Rfl:   Review of Systems:   ROS See pertinent positives and negatives as per the HPI.  Vitals:   Vitals:   09/14/23 1326  BP: 120/80  Pulse: 77  Temp: 97.8 F (36.6 C)  TempSrc: Temporal  SpO2: 97%  Weight: 147 lb 4 oz (66.8 kg)  Height: 5' 7.5" (1.715 m)     Body mass index is 22.72 kg/m.  Physical Exam:   Physical Exam Vitals and nursing note reviewed.  Constitutional:      General: She is not in acute distress.    Appearance: She is well-developed. She is not ill-appearing or toxic-appearing.  Cardiovascular:     Rate and Rhythm: Normal rate and regular rhythm.     Pulses: Normal pulses.     Heart sounds: Normal heart sounds, S1 normal and S2 normal.  Pulmonary:     Effort: Pulmonary effort is normal.     Breath sounds: Normal breath sounds.  Skin:    General: Skin is warm and dry.  Neurological:     Mental Status: She is alert.     GCS: GCS eye subscore is 4. GCS verbal subscore is 5. GCS motor subscore is 6.  Psychiatric:        Mood and Affect: Mood is anxious. Affect is tearful.        Speech: Speech normal.        Behavior: Behavior normal. Behavior is cooperative.     Assessment and Plan:   Other fatigue Chronic issue Suspect likelihood multifactorial She did refuse to complete Patient Health Questionnaire (PHQ) depression screen survey and General Anxiety Disorder - 7 question screening survey toady Denies suicidal ideation/hi Update blood work to rule out organic cause of symptom(s) Discussed that she is overdue for colonoscopy and mammogram -- she is agreeable to these being ordered today Encouraged continued efforts at healthy lifestyle and close follow-up with PCP in 2-4 weeks to make sure fatigue has not worsened   Vaginal atrophy Will provide one-time refill of vaginal estrogen cream while awaiting gynecology appointment  Breast cancer screening by mammogram Referral placed and contact info  provided  Special screening for malignant neoplasms, colon Referral placed and contact info provided  I,Emily Lagle,acting as a scribe for Jarold Motto, PA.,have documented all relevant documentation on the behalf of Jarold Motto, PA,as directed by  Jarold Motto, PA while in the presence of Jarold Motto, [name].  I, Jarold Motto, [name], have reviewed all documentation for this visit. The documentation on 09/14/23 for the exam, diagnosis, procedures, and orders are all accurate and complete.  I spent a total of 28 minutes on this visit, today 09/14/23, ordering  tests, discussing plan of care with patient and using shared-decision making on next steps, refilling medications, and documenting the findings in the note.   Jarold Motto, PA-C

## 2023-09-15 LAB — COMPREHENSIVE METABOLIC PANEL
ALT: 15 U/L (ref 0–35)
AST: 22 U/L (ref 0–37)
Albumin: 4.6 g/dL (ref 3.5–5.2)
Alkaline Phosphatase: 57 U/L (ref 39–117)
BUN: 15 mg/dL (ref 6–23)
CO2: 29 meq/L (ref 19–32)
Calcium: 9.4 mg/dL (ref 8.4–10.5)
Chloride: 103 meq/L (ref 96–112)
Creatinine, Ser: 0.71 mg/dL (ref 0.40–1.20)
GFR: 96.14 mL/min (ref >60.00)
Glucose, Bld: 88 mg/dL (ref 70–99)
Potassium: 4.2 meq/L (ref 3.5–5.1)
Sodium: 140 meq/L (ref 135–145)
Total Bilirubin: 1.4 mg/dL — ABNORMAL HIGH (ref 0.2–1.2)
Total Protein: 6.8 g/dL (ref 6.0–8.3)

## 2023-09-15 LAB — CBC WITH DIFFERENTIAL/PLATELET
Basophils Absolute: 0 10*3/uL (ref 0.0–0.1)
Basophils Relative: 0.4 % (ref 0.0–3.0)
Eosinophils Absolute: 0.1 10*3/uL (ref 0.0–0.7)
Eosinophils Relative: 1.2 % (ref 0.0–5.0)
HCT: 41.2 % (ref 36.0–46.0)
Hemoglobin: 13.6 g/dL (ref 12.0–15.0)
Lymphocytes Relative: 38.7 % (ref 12.0–46.0)
Lymphs Abs: 2 10*3/uL (ref 0.7–4.0)
MCHC: 32.9 g/dL (ref 30.0–36.0)
MCV: 87.7 fL (ref 78.0–100.0)
Monocytes Absolute: 0.3 10*3/uL (ref 0.1–1.0)
Monocytes Relative: 5.6 % (ref 3.0–12.0)
Neutro Abs: 2.8 10*3/uL (ref 1.4–7.7)
Neutrophils Relative %: 54.1 % (ref 43.0–77.0)
Platelets: 200 10*3/uL (ref 150.0–400.0)
RBC: 4.69 Mil/uL (ref 3.87–5.11)
RDW: 13.7 % (ref 11.5–15.5)
WBC: 5.1 10*3/uL (ref 4.0–10.5)

## 2023-09-15 LAB — IBC + FERRITIN
Ferritin: 17.2 ng/mL (ref 10.0–291.0)
Iron: 88 ug/dL (ref 42–145)
Saturation Ratios: 21.7 % (ref 20.0–50.0)
TIBC: 406 ug/dL (ref 250.0–450.0)
Transferrin: 290 mg/dL (ref 212.0–360.0)

## 2023-09-15 LAB — VITAMIN D 25 HYDROXY (VIT D DEFICIENCY, FRACTURES): VITD: 27.26 ng/mL — ABNORMAL LOW (ref 30.00–100.00)

## 2023-09-15 LAB — VITAMIN B12: Vitamin B-12: 382 pg/mL (ref 211–911)

## 2023-09-15 LAB — TSH: TSH: 0.98 u[IU]/mL (ref 0.35–5.50)

## 2023-09-30 ENCOUNTER — Encounter: Payer: Self-pay | Admitting: Family Medicine

## 2023-09-30 ENCOUNTER — Ambulatory Visit: Payer: 59 | Admitting: Family Medicine

## 2023-09-30 VITALS — BP 117/80 | HR 74 | Temp 97.8°F | Ht 67.5 in | Wt 154.8 lb

## 2023-09-30 DIAGNOSIS — F439 Reaction to severe stress, unspecified: Secondary | ICD-10-CM

## 2023-09-30 DIAGNOSIS — R5383 Other fatigue: Secondary | ICD-10-CM | POA: Diagnosis not present

## 2023-09-30 NOTE — Patient Instructions (Signed)
It was very nice to see you today!  You can try starting a B12 and vitamin D supplement.  You can also make sure that you are getting plenty of iron rich foods.  Return if symptoms worsen or fail to improve.   Take care, Dr Jimmey Ralph  PLEASE NOTE:  If you had any lab tests, please let us know if you have not heard back within a few days. You may see your results on mychart before we have a chance to review them but we will give you a call once they are reviewed by [place].   If we ordered any referrals today, please let us know if you have not heard from their office within the next week.   If you had any urgent prescriptions sent in today, please check with the pharmacy within an hour of our visit to make sure the prescription was transmitted appropriately.   Please try these tips to maintain a healthy lifestyle:  Eat at least 3 REAL meals and 1-2 snacks per day.  Aim for no more than 5 hours between eating.  If you eat breakfast, please do so within one hour of getting up.   Each meal should contain half fruits/vegetables, one quarter protein, and one quarter carbs (no bigger than a computer mouse)  Cut down on sweet beverages. This includes juice, soda, and sweet tea.   Drink at least 1 glass of water with each meal and aim for at least 8 glasses per day  Exercise at least 150 minutes every week.

## 2023-09-30 NOTE — Addendum Note (Signed)
Addended by: Ardith Dark on: 09/30/2023 02:06 PM   Modules accepted: Level of Service

## 2023-09-30 NOTE — Progress Notes (Signed)
   Valerie Gregory is a 52 y.o. female who presents today for an office visit.  Assessment/Plan:  Chronic Problems Addressed Today: Other fatigue Had a lengthy discussion with patient regarding her fatigue.  This is likely multifactorial in nature however she did have recent labs which showed low vitamin D and low range B12.  She will start taking supplement for both of these.  She did also have a low range ferritin.  Her hemoglobin is normal so do not think that this is contributing significantly however did recommend that she eat more iron rich foods such as leafy greens.  She can try taking iron supplement as well.  We did discuss pursuing a sleep study for further evaluation.  She declined at this time though will let us know if she changes her mind.  Stress She has been under more stress recently which is likely contributing to her fatigue as well.  Overall seem to be managing.  We did discuss referral for her to see a therapist and she may be interested in this however she is not sure if this will be approved through her insurance and does not wish for referral at this point.  She will check with insurance and let us know if she would like to pursue a referral.     Subjective:  HPI:  See A/P for status of chronic conditions.  Patient is here today for follow-up.  I last saw her a couple of years ago.  Since her last visit she is seeing the PA here a few times.  Most recently saw them a couple of weeks ago with concern for fatigue.  She had labs at that time including CBC, c-Met, TSH, B12, vitamin D, and iron panel.  B12 was 382 and vitamin D was 27.26.  She has started taking a vitamin D supplement.  Overall feels about the same as she did a couple of weeks ago.  She has been trying to be active.  She is trying to eat a healthy diet.  She has been under a lot more stress recently and also has been concerned about possible ADHD diagnosis however she does not want this to be evaluated at  this point.       Objective:  Physical Exam: BP 117/80   Pulse 74   Temp 97.8 F (36.6 C) (Temporal)   Ht 5' 7.5" (1.715 m)   Wt 154 lb 12.8 oz (70.2 kg)   LMP 10/26/2018 (Approximate)   SpO2 99%   BMI 23.89 kg/m   Gen: No acute distress, resting comfortably Neuro: Grossly normal, moves all extremities Psych: Normal affect and thought content      Teneil Shiller M. Jimmey Ralph, MD 09/30/2023 2:06 PM

## 2023-09-30 NOTE — Assessment & Plan Note (Signed)
She has been under more stress recently which is likely contributing to her fatigue as well.  Overall seem to be managing.  We did discuss referral for her to see a therapist and she may be interested in this however she is not sure if this will be approved through her insurance and does not wish for referral at this point.  She will check with insurance and let us know if she would like to pursue a referral.

## 2023-09-30 NOTE — Assessment & Plan Note (Addendum)
Had a lengthy discussion with patient regarding her fatigue.  This is likely multifactorial in nature however she did have recent labs which showed low vitamin D and low range B12.  She will start taking supplement for both of these.  She did also have a low range ferritin.  Her hemoglobin is normal so do not think that this is contributing significantly however did recommend that she eat more iron rich foods such as leafy greens.  She can try taking iron supplement as well.  We did discuss pursuing a sleep study for further evaluation.  She declined at this time though will let us know if she changes her mind.

## 2023-10-15 ENCOUNTER — Ambulatory Visit: Payer: 59

## 2024-07-27 ENCOUNTER — Other Ambulatory Visit: Payer: Self-pay | Admitting: Physician Assistant
